# Patient Record
Sex: Female | Born: 1990 | Race: Black or African American | Hispanic: No | Marital: Single | Smoking: Never smoker
Health system: Southern US, Community
[De-identification: ages and names within clinical notes are randomized; demographics above are authoritative.]

## PROBLEM LIST (undated history)

## (undated) ENCOUNTER — Inpatient Hospital Stay (HOSPITAL_COMMUNITY): Payer: Self-pay

## (undated) ENCOUNTER — Emergency Department (HOSPITAL_COMMUNITY): Disposition: A | Discharge status: Home / Self Care | Payer: Medicaid Other

## (undated) DIAGNOSIS — Z3490 Encounter for supervision of normal pregnancy, unspecified, unspecified trimester: Secondary | ICD-10-CM

## (undated) DIAGNOSIS — Z9189 Other specified personal risk factors, not elsewhere classified: Secondary | ICD-10-CM

## (undated) HISTORY — PX: TOOTH EXTRACTION: SUR596

---

## 2013-07-20 NOTE — L&D Delivery Note (Signed)
Delivery Note Pt had a large amount of vaginal bleeding with grapefruit sized clot passed.  Cx 4/80/high.  Placenta felt at os.  Dr. Debroah LoopArnold in to exam pt.  Pitocin started at 150cc/hr.  About 10 minutes later, pt had an urge to push. At  a non-viable female was delivered via  (Presentation: breech ).  APGAR: 0/0 ; weight pending.  40 units of pitocin diluted in 1000cc LR was infused rapidly IV.  The placenta separated spontaneously and delivered via CCT and maternal pushing effort.  It was inspected and appears to be intact with a 3 VC. It is going to be sent t opathology There were the following complications: none  Anesthesia: Epidural  Episiotomy: none Lacerations: none Suture Repair: n/a Est. Blood Loss (mL): 1000cc  Mom to postpartum.

## 2013-07-24 ENCOUNTER — Encounter (HOSPITAL_COMMUNITY): Payer: Self-pay | Admitting: *Deleted

## 2013-07-24 ENCOUNTER — Inpatient Hospital Stay (HOSPITAL_COMMUNITY)
Admission: AD | Admit: 2013-07-24 | Discharge: 2013-07-24 | Disposition: A | Discharge status: Home / Self Care | Payer: Medicaid Other | Source: Ambulatory Visit | Attending: Obstetrics & Gynecology | Admitting: Obstetrics & Gynecology

## 2013-07-24 DIAGNOSIS — O210 Mild hyperemesis gravidarum: Secondary | ICD-10-CM | POA: Insufficient documentation

## 2013-07-24 DIAGNOSIS — O219 Vomiting of pregnancy, unspecified: Secondary | ICD-10-CM

## 2013-07-24 LAB — URINE MICROSCOPIC-ADD ON

## 2013-07-24 LAB — URINALYSIS, ROUTINE W REFLEX MICROSCOPIC
BILIRUBIN URINE: NEGATIVE
GLUCOSE, UA: NEGATIVE mg/dL
Hgb urine dipstick: NEGATIVE
KETONES UR: 15 mg/dL — AB
Nitrite: NEGATIVE
Protein, ur: NEGATIVE mg/dL
Specific Gravity, Urine: 1.02 (ref 1.005–1.030)
Urobilinogen, UA: 1 mg/dL (ref 0.0–1.0)
pH: 7 (ref 5.0–8.0)

## 2013-07-24 LAB — POCT PREGNANCY, URINE: Preg Test, Ur: POSITIVE — AB

## 2013-07-24 MED ORDER — ONDANSETRON HCL 4 MG PO TABS: 4.0000 mg | ORAL_TABLET | Freq: Four times a day (QID) | ORAL | Status: DC

## 2013-07-24 MED ORDER — PROMETHAZINE HCL 25 MG PO TABS: 25.0000 mg | ORAL_TABLET | Freq: Four times a day (QID) | ORAL | Status: DC | PRN

## 2013-07-24 NOTE — Discharge Instructions (Signed)
Morning Sickness Morning sickness is when you feel sick to your stomach (nauseous) during pregnancy. You may feel sick to your stomach and throw up (vomit). You may feel sick in the morning, but you can feel this way any time of day. Some women feel very sick to their stomach and cannot stop throwing up (hyperemesis gravidarum). HOME CARE  Only take medicines as told by your doctor.  Take multivitamins as told by your doctor. Taking multivitamins before getting pregnant can stop or lessen the harshness of morning sickness.  Eat dry toast or unsalted crackers before getting out of bed.  Eat 5 to 6 small meals a day.  Eat dry and bland foods like rice and baked potatoes.  Do not drink liquids with meals. Drink between meals.  Do not eat greasy, fatty, or spicy foods.  Have someone cook for you if the smell of food causes you to feel sick or throw up.  If you feel sick to your stomach after taking prenatal vitamins, take them at night or with a snack.  Eat protein when you need a snack (nuts, yogurt, cheese).  Eat unsweetened gelatins for dessert.  Wear a bracelet used for sea sickness (acupressure wristband).  Go to a doctor that puts thin needles into certain body points (acupuncture) to improve how you feel.  Do not smoke.  Use a humidifier to keep the air in your house free of odors.  Get lots of fresh air. GET HELP IF:  You need medicine to feel better.  You feel dizzy or lightheaded.  You are losing weight. GET HELP RIGHT AWAY IF:   You feel very sick to your stomach and cannot stop throwing up.  You pass out (faint). Document Released: 08/13/2004 Document Revised: 03/08/2013 Document Reviewed: 12/21/2012 [facility] Patient Information 2014 [place], [place]. Pregnancy - First Trimester During sexual intercourse, millions of sperm go into the vagina. Only 1 sperm will penetrate and fertilize the female egg while it is in the Fallopian tube. One week later, the  fertilized egg implants into the wall of the uterus. An embryo begins to develop into a baby. At 6 to 8 weeks, the eyes and face are formed and the heartbeat can be seen on ultrasound. At the end of 12 weeks (first trimester), all the baby's organs are formed. Now that you are pregnant, you will want to do everything you can to have a healthy baby. Two of the most important things are to get good prenatal care and follow your caregiver's instructions. Prenatal care is all the medical care you receive before the baby's birth. It is given to prevent, find, and treat problems during the pregnancy and childbirth. PRENATAL EXAMS  During prenatal visits, your weight, blood pressure, and urine are checked. This is done to make sure you are healthy and progressing normally during the pregnancy.  A pregnant woman should gain 25 to 35 pounds during the pregnancy. However, if you are overweight or underweight, your caregiver will advise you regarding your weight.  Your caregiver will ask and answer questions for you.  Blood work, cervical cultures, other necessary tests, and a Pap test are done during your prenatal exams. These tests are done to check on your health and the probable health of your baby. Tests are strongly recommended and done for HIV with your permission. This is the virus that causes AIDS. These tests are done because medicines can be given to help prevent your baby from being born with this infection should you  have been infected without knowing it. Blood work is also used to find out your blood type, previous infections, and follow your blood levels (hemoglobin).  Low hemoglobin (anemia) is common during pregnancy. Iron and vitamins are given to help prevent this. Later in the pregnancy, blood tests for diabetes will be done along with any other tests if any problems develop.  You may need other tests to make sure you and the baby are doing well. CHANGES DURING THE FIRST TRIMESTER  Your body  goes through many changes during pregnancy. They vary from person to person. Talk to your caregiver about changes you notice and are concerned about. Changes can include:  Your menstrual period stops.  The egg and sperm carry the genes that determine what you look like. Genes from you and your partner are forming a baby. The female genes determine whether the baby is a boy or a girl.  Your body increases in girth and you may feel bloated.  Feeling sick to your stomach (nauseous) and throwing up (vomiting). If the vomiting is uncontrollable, call your caregiver.  Your breasts will begin to enlarge and become tender.  Your nipples may stick out more and become darker.  The need to urinate more. Painful urination may mean you have a bladder infection.  Tiring easily.  Loss of appetite.  Cravings for certain kinds of food.  At first, you may gain or lose a couple of pounds.  You may have changes in your emotions from day to day (excited to be pregnant or concerned something may go wrong with the pregnancy and baby).  You may have more vivid and strange dreams. HOME CARE INSTRUCTIONS   It is very important to avoid all smoking, alcohol and non-prescribed drugs during your pregnancy. These affect the formation and growth of the baby. Avoid chemicals while pregnant to ensure the delivery of a healthy infant.  Start your prenatal visits by the 12th week of pregnancy. They are usually scheduled monthly at first, then more often in the last 2 months before delivery. Keep your caregiver's appointments. Follow your caregiver's instructions regarding medicine use, blood and lab tests, exercise, and diet.  During pregnancy, you are providing food for you and your baby. Eat regular, well-balanced meals. Choose foods such as meat, fish, milk and other low fat dairy products, vegetables, fruits, and whole-grain breads and cereals. Your caregiver will tell you of the ideal weight gain.  You can help  morning sickness by keeping soda crackers at the bedside. Eat a couple before arising in the morning. You may want to use the crackers without salt on them.  Eating 4 to 5 small meals rather than 3 large meals a day also may help the nausea and vomiting.  Drinking liquids between meals instead of during meals also seems to help nausea and vomiting.  A physical sexual relationship may be continued throughout pregnancy if there are no other problems. Problems may be early (premature) leaking of amniotic fluid from the membranes, vaginal bleeding, or belly (abdominal) pain.  Exercise regularly if there are no restrictions. Check with your caregiver or physical therapist if you are unsure of the safety of some of your exercises. Greater weight gain will occur in the last 2 trimesters of pregnancy. Exercising will help:  Control your weight.  Keep you in shape.  Prepare you for labor and delivery.  Help you lose your pregnancy weight after you deliver your baby.  Wear a good support or jogging bra for breast  tenderness during pregnancy. This may help if worn during sleep too.  Ask when prenatal classes are available. Begin classes when they are offered.  Do not use hot tubs, steam rooms, or saunas.  Wear your seat belt when driving. This protects you and your baby if you are in an accident.  Avoid raw meat, uncooked cheese, cat litter boxes, and soil used by cats throughout the pregnancy. These carry germs that can cause birth defects in the baby.  The first trimester is a good time to visit your dentist for your dental health. Getting your teeth cleaned is okay. Use a softer toothbrush and brush gently during pregnancy.  Ask for help if you have financial, counseling, or nutritional needs during pregnancy. Your caregiver will be able to offer counseling for these needs as well as refer you for other special needs.  Do not take any medicines or herbs unless told by your  caregiver.  Inform your caregiver if there is any mental or physical domestic violence.  Make a list of emergency phone numbers of family, friends, hospital, and police and fire departments.  Write down your questions. Take them to your prenatal visit.  Do not douche.  Do not cross your legs.  If you have to stand for long periods of time, rotate you feet or take small steps in a circle.  You may have more vaginal secretions that may require a sanitary pad. Do not use tampons or scented sanitary pads. MEDICINES AND DRUG USE IN PREGNANCY  Take prenatal vitamins as directed. The vitamin should contain 1 milligram of folic acid. Keep all vitamins out of reach of children. Only a couple vitamins or tablets containing iron may be fatal to a baby or young child when ingested.  Avoid use of all medicines, including herbs, over-the-counter medicines, not prescribed or suggested by your caregiver. Only take over-the-counter or prescription medicines for pain, discomfort, or fever as directed by your caregiver. Do not use aspirin, ibuprofen, or naproxen unless directed by your caregiver.  Let your caregiver also know about herbs you may be using.  Alcohol is related to a number of birth defects. This includes fetal alcohol syndrome. All alcohol, in any form, should be avoided completely. Smoking will cause low birth rate and premature babies.  Street or illegal drugs are very harmful to the baby. They are absolutely forbidden. A baby born to an addicted mother will be addicted at birth. The baby will go through the same withdrawal an adult does.  Let your caregiver know about any medicines that you have to take and for what reason you take them. SEEK MEDICAL CARE IF:  You have any concerns or worries during your pregnancy. It is better to call with your questions if you feel they cannot wait, rather than worry about them. SEEK IMMEDIATE MEDICAL CARE IF:   An unexplained oral temperature above  102 F (38.9 C) develops, or as your caregiver suggests.  You have leaking of fluid from the vagina (birth canal). If leaking membranes are suspected, take your temperature and inform your caregiver of this when you call.  There is vaginal spotting or bleeding. Notify your caregiver of the amount and how many pads are used.  You develop a bad smelling vaginal discharge with a change in the color.  You continue to feel sick to your stomach (nauseated) and have no relief from remedies suggested. You vomit blood or coffee ground-like materials.  You lose more than 2 pounds of weight in  1 week.  You gain more than 2 pounds of weight in 1 week and you notice swelling of your face, hands, feet, or legs.  You gain 5 pounds or more in 1 week (even if you do not have swelling of your hands, face, legs, or feet).  You get exposed to [place] measles and have never had them.  You are exposed to fifth disease or chickenpox.  You develop belly (abdominal) pain. Round ligament discomfort is a common non-cancerous (benign) cause of abdominal pain in pregnancy. Your caregiver still must evaluate this.  You develop headache, fever, diarrhea, pain with urination, or shortness of breath.  You fall or are in a car accident or have any kind of trauma.  There is mental or physical violence in your home. Document Released: 06/30/2001 Document Revised: 03/30/2012 Document Reviewed: 01/01/2009 [facility] Patient Information 2014 [place], [place].  For assistance with applying for medicaid please call Fausto Skillern [phone] or Anne Hahn [phone].

## 2013-07-24 NOTE — MAU Note (Signed)
Pt reports nauseated x 2 months, vomiting occasionally. LMP 06/03/2013. Negative preg test in Nov.

## 2013-07-24 NOTE — MAU Provider Note (Signed)
History     CSN: [id number]  Arrival date and time: 07/24/13 4098   First Provider Initiated Contact with Patient 07/24/13 2228      Chief Complaint  Patient presents with  . Possible Pregnancy  . Nausea   HPI Ms. Rhonda Gordon is a 23 y.o. G1P0 at [redacted]w[redacted]d who presents to MAU today with complaint of N/V x 2 months. The patient states that the nausea is daily and vomiting is occasional and usually related to certain foods. She has occasional headaches as well and has not taken any pain medications in > 1 week. She denies abdominal pain or vaginal bleeding today. LMP was 06/03/13.   OB History   Grav Para Term Preterm Abortions TAB SAB Ect Mult Living   1               Past Medical History  Diagnosis Date  . Medical history non-contributory     Past Surgical History  Procedure Laterality Date  . No past surgeries      History reviewed. No pertinent family history.  History  Substance Use Topics  . Smoking status: Never Smoker   . Smokeless tobacco: Not on file  . Alcohol Use: No    Allergies: No Known Allergies  No prescriptions prior to admission    Review of Systems  Gastrointestinal: Positive for nausea and vomiting. Negative for abdominal pain, diarrhea and constipation.  Genitourinary: Negative for dysuria, urgency and frequency.       Neg - vaginal bleeding, discharge   Physical Exam   Blood pressure 125/70, pulse 72, temperature 99 F (37.2 C), temperature source Oral, resp. rate 18, height 5\' 6"  (1.676 m), weight 167 lb (75.751 kg), last menstrual period 06/03/2013, SpO2 100.00%.  Physical Exam  Constitutional: She is oriented to person, place, and time. She appears well-developed and well-nourished. No distress.  HENT:  Head: Normocephalic and atraumatic.  Cardiovascular: Normal rate.   Respiratory: Effort normal.  GI: Soft.  Neurological: She is alert and oriented to person, place, and time.  Skin: Skin is warm and dry. No erythema.   Psychiatric: She has a normal mood and affect.   Results for orders placed during the hospital encounter of 07/24/13 (from the past 24 hour(s))  URINALYSIS, ROUTINE W REFLEX MICROSCOPIC     Status: Abnormal   Collection Time    07/24/13  8:07 PM      Result Value Range   Color, Urine YELLOW  YELLOW   APPearance CLEAR  CLEAR   Specific Gravity, Urine 1.020  1.005 - 1.030   pH 7.0  5.0 - 8.0   Glucose, UA NEGATIVE  NEGATIVE mg/dL   Hgb urine dipstick NEGATIVE  NEGATIVE   Bilirubin Urine NEGATIVE  NEGATIVE   Ketones, ur 15 (*) NEGATIVE mg/dL   Protein, ur NEGATIVE  NEGATIVE mg/dL   Urobilinogen, UA 1.0  0.0 - 1.0 mg/dL   Nitrite NEGATIVE  NEGATIVE   Leukocytes, UA SMALL (*) NEGATIVE  URINE MICROSCOPIC-ADD ON     Status: Abnormal   Collection Time    07/24/13  8:07 PM      Result Value Range   Squamous Epithelial / LPF RARE  RARE   WBC, UA 0-2  <3 WBC/hpf   RBC / HPF 0-2  <3 RBC/hpf   Bacteria, UA FEW (*) RARE   Urine-Other MUCOUS PRESENT    POCT PREGNANCY, URINE     Status: Abnormal   Collection Time    07/24/13  8:38 PM  Result Value Range   Preg Test, Ur POSITIVE (*) NEGATIVE    MAU Course  Procedures None  MDM +UPT UA shows mild dehydration. Patient denies dehydration or nausea at this time.   Assessment and Plan  A: Nausea and vomiting in pregnancy prior to [redacted] weeks gestation + UPT  P: Discharge home Rx for Phenergan and Zofran sent to patient's pharmacy First trimester warning sign discussed [facility] will call her with an appointment Patient may return to MAU as needed or if her condition were to change or worsen  Freddi StarrJulie N Ethier, PA-C  07/25/2013, 12:29 AM

## 2013-08-07 ENCOUNTER — Encounter: Payer: Self-pay | Admitting: Obstetrics and Gynecology

## 2013-09-13 ENCOUNTER — Encounter: Payer: Self-pay | Admitting: Obstetrics and Gynecology

## 2013-09-13 ENCOUNTER — Other Ambulatory Visit (HOSPITAL_COMMUNITY)
Admission: RE | Admit: 2013-09-13 | Discharge: 2013-09-13 | Disposition: A | Discharge status: Home / Self Care | Payer: Medicaid Other | Source: Ambulatory Visit | Attending: Obstetrics and Gynecology | Admitting: Obstetrics and Gynecology

## 2013-09-13 ENCOUNTER — Ambulatory Visit (INDEPENDENT_AMBULATORY_CARE_PROVIDER_SITE_OTHER): Payer: Medicaid Other | Admitting: Obstetrics and Gynecology

## 2013-09-13 VITALS — BP 112/69 | Temp 98.4°F | Wt 175.1 lb

## 2013-09-13 DIAGNOSIS — O219 Vomiting of pregnancy, unspecified: Secondary | ICD-10-CM

## 2013-09-13 DIAGNOSIS — Z113 Encounter for screening for infections with a predominantly sexual mode of transmission: Secondary | ICD-10-CM | POA: Insufficient documentation

## 2013-09-13 DIAGNOSIS — Z3402 Encounter for supervision of normal first pregnancy, second trimester: Secondary | ICD-10-CM

## 2013-09-13 DIAGNOSIS — Z01419 Encounter for gynecological examination (general) (routine) without abnormal findings: Secondary | ICD-10-CM | POA: Insufficient documentation

## 2013-09-13 DIAGNOSIS — Z23 Encounter for immunization: Secondary | ICD-10-CM

## 2013-09-13 DIAGNOSIS — Z3400 Encounter for supervision of normal first pregnancy, unspecified trimester: Secondary | ICD-10-CM

## 2013-09-13 DIAGNOSIS — O0930 Supervision of pregnancy with insufficient antenatal care, unspecified trimester: Secondary | ICD-10-CM

## 2013-09-13 DIAGNOSIS — O0932 Supervision of pregnancy with insufficient antenatal care, second trimester: Secondary | ICD-10-CM

## 2013-09-13 LAB — POCT URINALYSIS DIP (DEVICE)
Bilirubin Urine: NEGATIVE
GLUCOSE, UA: NEGATIVE mg/dL
Ketones, ur: NEGATIVE mg/dL
NITRITE: NEGATIVE
Protein, ur: NEGATIVE mg/dL
Specific Gravity, Urine: 1.025 (ref 1.005–1.030)
Urobilinogen, UA: 0.2 mg/dL (ref 0.0–1.0)
pH: 6.5 (ref 5.0–8.0)

## 2013-09-13 LAB — HIV ANTIBODY (ROUTINE TESTING W REFLEX): HIV: NONREACTIVE

## 2013-09-13 NOTE — Progress Notes (Signed)
P= 106 C/o of intermittent lower abdominal/pelvic pain and pain in buttox.  Pt. States she is unaware if her dating is correct because she was spotting in November but her real last period was in October.  Discussed appropriate weigt gain based on BMI ( 15-25lb); pt. Verbalized understanding.  New OB packet given.  New OB labs today, pap, and flu today.

## 2013-09-13 NOTE — Progress Notes (Signed)
   Subjective:    Rhonda Gordon is a G1P0 5036w0d G1 being seen today for her first obstetrical visit.  Her obstetrical history is significant for late entry to care. Patient does intend to breast feed. Pregnancy history fully reviewed.  Patient reports no complaints. N/V resolved  Filed Vitals:   09/13/13 0824  BP: 140/81  Temp: 98.4 F (36.9 C)  Weight: 79.425 kg (175 lb 1.6 oz)    HISTORY: OB History  Gravida Para Term Preterm AB SAB TAB Ectopic Multiple Living  1             # Outcome Date GA Lbr Len/2nd Weight Sex Delivery Anes PTL Lv  1 CUR              Past Medical History  Diagnosis Date  . Medical history non-contributory    Past Surgical History  Procedure Laterality Date  . No past surgeries    . Tooth extraction     Family History  Problem Relation Age of Onset  . Hypertension Mother      Exam    Uterus:   18-20 wk size, FH 17cm  Pelvic Exam:    Perineum: Small fleshy nevi on left mlabium majoram   Vulva: Bartholin's, Urethra, Skene's normal   Vagina:  normal mucosa, normal discharge       Cervix: nulliparous appearance and ext ftp/ int closed 2.5 cm long/high   Adnexa: not evaluated   Bony Pelvis: average  System: Breast:  normal appearance, no masses or tenderness   Skin: normal coloration and turgor, no rashes    Neurologic: oriented, normal, grossly non-focal   Extremities: normal strength, tone, and muscle mass   HEENT PERRLA, extra ocular movement intact, neck supple with midline trachea and thyroid without masses   Mouth/Teeth mucous membranes moist, pharynx normal without lesions and dental hygiene good   Neck supple and no masses   Cardiovascular: regular rate and rhythm, no murmurs or gallops   Respiratory:  appears well, vitals normal, no respiratory distress, acyanotic, normal RR, ear and throat exam is normal, neck free of mass or lymphadenopathy, chest clear, no wheezing, crepitations, rhonchi, normal symmetric air entry   Abdomen: DT 140, NT   Urinary: urethral meatus normal      Assessment:    Pregnancy: G1P0 Patient Active Problem List   Diagnosis Date Noted  . Need for prophylactic vaccination and inoculation against influenza 09/13/2013  . Encounter for supervision of normal first pregnancy in second trimester 09/13/2013  . Insufficient prenatal care in second trimester 09/13/2013        Plan:     Initial labs drawn. Prenatal vitamins. Problem list reviewed and updated. Genetic Screening discussed Quad Screen: ordered.  Ultrasound discussed; fetal survey: ordered.  Follow up in 4 weeks. 50% of 30  min visit spent on counseling and coordination of care.  Visit schedule, pregnancy precautions reviewed. Flu vaccine and PN labs today  Watch BP  Sha Amer 09/13/2013

## 2013-09-13 NOTE — Patient Instructions (Signed)
Second Trimester of Pregnancy The second trimester is from week 13 through week 28, months 4 through 6. The second trimester is often a time when you feel your best. Your body has also adjusted to being pregnant, and you begin to feel better physically. Usually, morning sickness has lessened or quit completely, you may have more energy, and you may have an increase in appetite. The second trimester is also a time when the fetus is growing rapidly. At the end of the sixth month, the fetus is about 9 inches long and weighs about 1 pounds. You will likely begin to feel the baby move (quickening) between 18 and 20 weeks of the pregnancy. BODY CHANGES Your body goes through many changes during pregnancy. The changes vary from woman to woman.   Your weight will continue to increase. You will notice your lower abdomen bulging out.  You may begin to get stretch marks on your hips, abdomen, and breasts.  You may develop headaches that can be relieved by medicines approved by your caregiver.  You may urinate more often because the fetus is pressing on your bladder.  You may develop or continue to have heartburn as a result of your pregnancy.  You may develop constipation because certain hormones are causing the muscles that push waste through your intestines to slow down.  You may develop hemorrhoids or swollen, bulging veins (varicose veins).  You may have back pain because of the weight gain and pregnancy hormones relaxing your joints between the bones in your pelvis and as a result of a shift in weight and the muscles that support your balance.  Your breasts will continue to grow and be tender.  Your gums may bleed and may be sensitive to brushing and flossing.  Dark spots or blotches (chloasma, mask of pregnancy) may develop on your face. This will likely fade after the baby is born.  A dark line from your belly button to the pubic area (linea nigra) may appear. This will likely fade after the  baby is born. WHAT TO EXPECT AT YOUR PRENATAL VISITS During a routine prenatal visit:  You will be weighed to make sure you and the fetus are growing normally.  Your blood pressure will be taken.  Your abdomen will be measured to track your baby's growth.  The fetal heartbeat will be listened to.  Any test results from the previous visit will be discussed. Your caregiver may ask you:  How you are feeling.  If you are feeling the baby move.  If you have had any abnormal symptoms, such as leaking fluid, bleeding, severe headaches, or abdominal cramping.  If you have any questions. Other tests that may be performed during your second trimester include:  Blood tests that check for:  Low iron levels (anemia).  Gestational diabetes (between 24 and 28 weeks).  Rh antibodies.  Urine tests to check for infections, diabetes, or protein in the urine.  An ultrasound to confirm the proper growth and development of the baby.  An amniocentesis to check for possible genetic problems.  Fetal screens for spina bifida and Down syndrome. HOME CARE INSTRUCTIONS   Avoid all smoking, herbs, alcohol, and unprescribed drugs. These chemicals affect the formation and growth of the baby.  Follow your caregiver's instructions regarding medicine use. There are medicines that are either safe or unsafe to take during pregnancy.  Exercise only as directed by your caregiver. Experiencing uterine cramps is a good sign to stop exercising.  Continue to eat regular,   healthy meals.  Wear a good support bra for breast tenderness.  Do not use hot tubs, steam rooms, or saunas.  Wear your seat belt at all times when driving.  Avoid raw meat, uncooked cheese, cat litter boxes, and soil used by cats. These carry germs that can cause birth defects in the baby.  Take your prenatal vitamins.  Try taking a stool softener (if your caregiver approves) if you develop constipation. Eat more high-fiber foods,  such as fresh vegetables or fruit and whole grains. Drink plenty of fluids to keep your urine clear or pale yellow.  Take warm sitz baths to soothe any pain or discomfort caused by hemorrhoids. Use hemorrhoid cream if your caregiver approves.  If you develop varicose veins, wear support hose. Elevate your feet for 15 minutes, 3 4 times a day. Limit salt in your diet.  Avoid heavy lifting, wear low heel shoes, and practice good posture.  Rest with your legs elevated if you have leg cramps or low back pain.  Visit your dentist if you have not gone yet during your pregnancy. Use a soft toothbrush to brush your teeth and be gentle when you floss.  A sexual relationship may be continued unless your caregiver directs you otherwise.  Continue to go to all your prenatal visits as directed by your caregiver. SEEK MEDICAL CARE IF:   You have dizziness.  You have mild pelvic cramps, pelvic pressure, or nagging pain in the abdominal area.  You have persistent nausea, vomiting, or diarrhea.  You have a bad smelling vaginal discharge.  You have pain with urination. SEEK IMMEDIATE MEDICAL CARE IF:   You have a fever.  You are leaking fluid from your vagina.  You have spotting or bleeding from your vagina.  You have severe abdominal cramping or pain.  You have rapid weight gain or loss.  You have shortness of breath with chest pain.  You notice sudden or extreme swelling of your face, hands, ankles, feet, or legs.  You have not felt your baby move in over an hour.  You have severe headaches that do not go away with medicine.  You have vision changes. Document Released: 06/30/2001 Document Revised: 03/08/2013 Document Reviewed: 09/06/2012 [employer] Patient Information 2014 [employer].  

## 2013-09-14 ENCOUNTER — Observation Stay (HOSPITAL_COMMUNITY)
Admission: AD | Admit: 2013-09-14 | Discharge: 2013-09-15 | Disposition: A | Discharge status: Home / Self Care | Payer: Medicaid Other | Source: Ambulatory Visit | Attending: Obstetrics & Gynecology | Admitting: Obstetrics & Gynecology

## 2013-09-14 ENCOUNTER — Inpatient Hospital Stay (HOSPITAL_COMMUNITY): Payer: Medicaid Other

## 2013-09-14 ENCOUNTER — Encounter (HOSPITAL_COMMUNITY): Payer: Self-pay | Admitting: *Deleted

## 2013-09-14 DIAGNOSIS — O09212 Supervision of pregnancy with history of pre-term labor, second trimester: Secondary | ICD-10-CM

## 2013-09-14 DIAGNOSIS — Z87891 Personal history of nicotine dependence: Secondary | ICD-10-CM | POA: Insufficient documentation

## 2013-09-14 DIAGNOSIS — N949 Unspecified condition associated with female genital organs and menstrual cycle: Secondary | ICD-10-CM | POA: Insufficient documentation

## 2013-09-14 DIAGNOSIS — O034 Incomplete spontaneous abortion without complication: Principal | ICD-10-CM | POA: Insufficient documentation

## 2013-09-14 DIAGNOSIS — O3430 Maternal care for cervical incompetence, unspecified trimester: Secondary | ICD-10-CM | POA: Insufficient documentation

## 2013-09-14 LAB — PRESCRIPTION MONITORING PROFILE (19 PANEL)
Amphetamine/Meth: NEGATIVE ng/mL
BENZODIAZEPINE SCREEN, URINE: NEGATIVE ng/mL
Barbiturate Screen, Urine: NEGATIVE ng/mL
Buprenorphine, Urine: NEGATIVE ng/mL
Cannabinoid Scrn, Ur: NEGATIVE ng/mL
Carisoprodol, Urine: NEGATIVE ng/mL
Cocaine Metabolites: NEGATIVE ng/mL
Creatinine, Urine: 112.36 mg/dL (ref >20.0)
ECSTASY: NEGATIVE ng/mL
FENTANYL URINE: NEGATIVE ng/mL
Meperidine, Ur: NEGATIVE ng/mL
Methadone Screen, Urine: NEGATIVE ng/mL
Methaqualone: NEGATIVE ng/mL
Nitrites, Initial: NEGATIVE ug/mL
Opiate Screen, Urine: NEGATIVE ng/mL
Oxycodone Screen, Ur: NEGATIVE ng/mL
PH URINE, INITIAL: 6.4 pH (ref 4.5–8.9)
Phencyclidine, Ur: NEGATIVE ng/mL
Propoxyphene: NEGATIVE ng/mL
TRAMADOL UR: NEGATIVE ng/mL
Tapentadol, urine: NEGATIVE ng/mL
ZOLPIDEM, URINE: NEGATIVE ng/mL

## 2013-09-14 LAB — OBSTETRIC PANEL
ANTIBODY SCREEN: NEGATIVE
BASOS ABS: 0 10*3/uL (ref 0.0–0.1)
Basophils Relative: 0 % (ref 0–1)
EOS PCT: 1 % (ref 0–5)
Eosinophils Absolute: 0.1 10*3/uL (ref 0.0–0.7)
HEMATOCRIT: 34.7 % — AB (ref 36.0–46.0)
HEMOGLOBIN: 12.1 g/dL (ref 12.0–15.0)
Hepatitis B Surface Ag: NEGATIVE
Lymphocytes Relative: 21 % (ref 12–46)
Lymphs Abs: 1.5 10*3/uL (ref 0.7–4.0)
MCH: 31 pg (ref 26.0–34.0)
MCHC: 34.9 g/dL (ref 30.0–36.0)
MCV: 89 fL (ref 78.0–100.0)
MONO ABS: 0.6 10*3/uL (ref 0.1–1.0)
MONOS PCT: 8 % (ref 3–12)
NEUTROS ABS: 5 10*3/uL (ref 1.7–7.7)
Neutrophils Relative %: 70 % (ref 43–77)
Platelets: 215 10*3/uL (ref 150–400)
RBC: 3.9 MIL/uL (ref 3.87–5.11)
RDW: 14.6 % (ref 11.5–15.5)
RH TYPE: NEGATIVE
Rubella: 0.41 Index (ref <0.90)
WBC: 7.1 10*3/uL (ref 4.0–10.5)

## 2013-09-14 LAB — URINE MICROSCOPIC-ADD ON

## 2013-09-14 LAB — URINALYSIS, ROUTINE W REFLEX MICROSCOPIC
BILIRUBIN URINE: NEGATIVE
Glucose, UA: NEGATIVE mg/dL
KETONES UR: NEGATIVE mg/dL
Nitrite: NEGATIVE
PH: 7 (ref 5.0–8.0)
Protein, ur: NEGATIVE mg/dL
Urobilinogen, UA: 0.2 mg/dL (ref 0.0–1.0)

## 2013-09-14 LAB — ALCOHOL METABOLITE (ETG), URINE: Ethyl Glucuronide (EtG): NEGATIVE ng/mL

## 2013-09-14 MED ORDER — OXYTOCIN BOLUS FROM INFUSION: 500.0000 mL | INTRAVENOUS | Status: DC

## 2013-09-14 MED ORDER — BUTORPHANOL TARTRATE 1 MG/ML IJ SOLN
1.0000 mg | INTRAMUSCULAR | Status: DC | PRN
  Administered 2013-09-15: 1 mg via INTRAVENOUS
  Filled 2013-09-14: qty 1

## 2013-09-14 MED ORDER — ACETAMINOPHEN 325 MG PO TABS: 650.0000 mg | ORAL_TABLET | ORAL | Status: DC | PRN

## 2013-09-14 MED ORDER — IBUPROFEN 600 MG PO TABS
600.0000 mg | ORAL_TABLET | Freq: Four times a day (QID) | ORAL | Status: DC | PRN
Start: 2013-09-14 — End: 2013-09-15

## 2013-09-14 MED ORDER — LACTATED RINGERS IV SOLN: 500.0000 mL | INTRAVENOUS | Status: DC | PRN

## 2013-09-14 MED ORDER — CITRIC ACID-SODIUM CITRATE 334-500 MG/5ML PO SOLN
30.0000 mL | ORAL | Status: DC | PRN
  Filled 2013-09-14 (×2): qty 15

## 2013-09-14 MED ORDER — ONDANSETRON HCL 4 MG/2ML IJ SOLN: 4.0000 mg | Freq: Four times a day (QID) | INTRAMUSCULAR | Status: DC | PRN

## 2013-09-14 MED ORDER — OXYTOCIN 40 UNITS IN LACTATED RINGERS INFUSION - SIMPLE MED
62.5000 mL/h | INTRAVENOUS | Status: DC
  Administered 2013-09-15: 150 mL/h via INTRAVENOUS
  Filled 2013-09-14: qty 1000

## 2013-09-14 MED ORDER — LACTATED RINGERS IV SOLN
INTRAVENOUS | Status: DC
  Administered 2013-09-15: via INTRAVENOUS

## 2013-09-14 MED ORDER — LIDOCAINE HCL (PF) 1 % IJ SOLN
30.0000 mL | INTRAMUSCULAR | Status: DC | PRN
  Filled 2013-09-14: qty 30

## 2013-09-14 MED ORDER — OXYCODONE-ACETAMINOPHEN 5-325 MG PO TABS: 1.0000 | ORAL_TABLET | ORAL | Status: DC | PRN

## 2013-09-14 NOTE — MAU Note (Signed)
Pt reports lower abd pressure and pain x 4 hours, ? Leaking fluid.

## 2013-09-14 NOTE — MAU Provider Note (Signed)
History     CSN: [id number]  Arrival date and time: 09/14/13 2126   First Provider Initiated Contact with Patient 09/14/13 2210      Chief Complaint  Patient presents with  . Pelvic Pain  . Rupture of Membranes   Pelvic Pain The patient's primary symptoms include pelvic pain.    Rhonda Gordon is a 23 y.o. G1P0 at 2236w1d who presents today with pressure and possible leaking of fluid. She states that she was seen yesterday and had a cervical exam, and was told that her "cervix seemed short". Today she has been cramping off and on, and has had some bleeding since arriving here.   Past Medical History  Diagnosis Date  . Medical history non-contributory     Past Surgical History  Procedure Laterality Date  . No past surgeries    . Tooth extraction      Family History  Problem Relation Age of Onset  . Hypertension Mother     History  Substance Use Topics  . Smoking status: Former Games developermoker  . Smokeless tobacco: Never Used  . Alcohol Use: No    Allergies: No Known Allergies  Prescriptions prior to admission  Medication Sig Dispense Refill  . ondansetron (ZOFRAN) 4 MG tablet Take 1 tablet (4 mg total) by mouth every 6 (six) hours.  12 tablet  0  . Prenatal Vit-Fe Fumarate-FA (MULTIVITAMIN-PRENATAL) 27-0.8 MG TABS tablet Take 1 tablet by mouth daily at 12 noon.      . promethazine (PHENERGAN) 25 MG tablet Take 1 tablet (25 mg total) by mouth every 6 (six) hours as needed for nausea or vomiting.  30 tablet  0    Review of Systems  Genitourinary: Positive for pelvic pain.   Physical Exam   Blood pressure 127/80, pulse 109, temperature 98.6 F (37 C), temperature source Oral, resp. rate 18, height 5\' 6"  (1.676 m), weight 80.74 kg (178 lb), last menstrual period 05/03/2013, SpO2 100.00%.  Physical Exam  Nursing note and vitals reviewed. Constitutional: She is oriented to person, place, and time. She appears well-developed and well-nourished. No distress.   Cardiovascular: Normal rate.   Respiratory: Effort normal.  GI: Soft. There is no tenderness.  Genitourinary:   External: no lesion Vagina: small amount of blood seen  Cervix: unable to see any cervix. Membranes bulging into vagina.  Uterus: AGA, +FHT    Neurological: She is alert and oriented to person, place, and time.  Skin: Skin is warm and dry.  Psychiatric: She has a normal mood and affect.    MAU Course  Procedures  Results for orders placed during the hospital encounter of 09/14/13 (from the past 24 hour(s))  URINALYSIS, ROUTINE W REFLEX MICROSCOPIC     Status: Abnormal   Collection Time    09/14/13  9:36 PM      Result Value Ref Range   Color, Urine YELLOW  YELLOW   APPearance CLEAR  CLEAR   Specific Gravity, Urine <1.005 (*) 1.005 - 1.030   pH 7.0  5.0 - 8.0   Glucose, UA NEGATIVE  NEGATIVE mg/dL   Hgb urine dipstick MODERATE (*) NEGATIVE   Bilirubin Urine NEGATIVE  NEGATIVE   Ketones, ur NEGATIVE  NEGATIVE mg/dL   Protein, ur NEGATIVE  NEGATIVE mg/dL   Urobilinogen, UA 0.2  0.0 - 1.0 mg/dL   Nitrite NEGATIVE  NEGATIVE   Leukocytes, UA TRACE (*) NEGATIVE  URINE MICROSCOPIC-ADD ON     Status: None   Collection Time  09/14/13  9:36 PM      Result Value Ref Range   Squamous Epithelial / LPF RARE  RARE   WBC, UA 0-2  <3 WBC/hpf   RBC / HPF 0-2  <3 RBC/hpf   Bacteria, UA RARE  RARE   2303: D/W Dr. Debroah Loop. Will await ultrasound report for GA and cervical measurements. He will come to the unit to see the patient.   Assessment and Plan  Cervical insufficiency  Pre-viable fetus 22.1 weeks by ultrasound with uncertain dating and preterm labor and advanced cervical dilation and effacement. She appears uncomfortable in active labor. She was advised that resuscitation would not be performed at this gestational age as the baby could not respond and survive.   Plan: Admit to L&D Adam Phenix, MD 11:46 PM  Tawnya Crook 09/14/2013, 10:19 PM

## 2013-09-15 ENCOUNTER — Encounter (HOSPITAL_COMMUNITY): Payer: Self-pay | Admitting: *Deleted

## 2013-09-15 DIAGNOSIS — O3430 Maternal care for cervical incompetence, unspecified trimester: Secondary | ICD-10-CM

## 2013-09-15 DIAGNOSIS — N949 Unspecified condition associated with female genital organs and menstrual cycle: Secondary | ICD-10-CM

## 2013-09-15 DIAGNOSIS — Z87891 Personal history of nicotine dependence: Secondary | ICD-10-CM

## 2013-09-15 DIAGNOSIS — O034 Incomplete spontaneous abortion without complication: Secondary | ICD-10-CM

## 2013-09-15 LAB — CBC
HCT: 26.3 % — ABNORMAL LOW (ref 36.0–46.0)
HCT: 32.6 % — ABNORMAL LOW (ref 36.0–46.0)
Hemoglobin: 11.6 g/dL — ABNORMAL LOW (ref 12.0–15.0)
Hemoglobin: 9.4 g/dL — ABNORMAL LOW (ref 12.0–15.0)
MCH: 31 pg (ref 26.0–34.0)
MCH: 31.3 pg (ref 26.0–34.0)
MCHC: 35.6 g/dL (ref 30.0–36.0)
MCHC: 35.7 g/dL (ref 30.0–36.0)
MCV: 86.8 fL (ref 78.0–100.0)
MCV: 87.9 fL (ref 78.0–100.0)
PLATELETS: 199 10*3/uL (ref 150–400)
PLATELETS: 224 10*3/uL (ref 150–400)
RBC: 3.03 MIL/uL — ABNORMAL LOW (ref 3.87–5.11)
RBC: 3.71 MIL/uL — ABNORMAL LOW (ref 3.87–5.11)
RDW: 13.2 % (ref 11.5–15.5)
RDW: 13.3 % (ref 11.5–15.5)
WBC: 12.7 10*3/uL — ABNORMAL HIGH (ref 4.0–10.5)
WBC: 9.7 10*3/uL (ref 4.0–10.5)

## 2013-09-15 LAB — RAPID URINE DRUG SCREEN, HOSP PERFORMED
AMPHETAMINES: NOT DETECTED
BARBITURATES: NOT DETECTED
Benzodiazepines: NOT DETECTED
Cocaine: NOT DETECTED
Opiates: NOT DETECTED
TETRAHYDROCANNABINOL: NOT DETECTED

## 2013-09-15 LAB — ABO/RH: ABO/RH(D): A NEG

## 2013-09-15 LAB — PREPARE RBC (CROSSMATCH)

## 2013-09-15 MED ORDER — RHO D IMMUNE GLOBULIN 1500 UNIT/2ML IJ SOLN
300.0000 ug | Freq: Once | INTRAMUSCULAR | Status: AC
Start: 2013-09-15 — End: 2013-09-15
  Administered 2013-09-15: 300 ug via INTRAMUSCULAR
  Filled 2013-09-15: qty 2

## 2013-09-15 MED ORDER — ONDANSETRON HCL 4 MG/2ML IJ SOLN: 4.0000 mg | INTRAMUSCULAR | Status: DC | PRN

## 2013-09-15 MED ORDER — SENNOSIDES-DOCUSATE SODIUM 8.6-50 MG PO TABS: 2.0000 | ORAL_TABLET | ORAL | Status: DC

## 2013-09-15 MED ORDER — MISOPROSTOL 200 MCG PO TABS
400.0000 ug | ORAL_TABLET | Freq: Once | ORAL | Status: AC
  Administered 2013-09-15: 400 ug via ORAL
  Filled 2013-09-15: qty 2

## 2013-09-15 MED ORDER — SIMETHICONE 80 MG PO CHEW: 80.0000 mg | CHEWABLE_TABLET | ORAL | Status: DC | PRN

## 2013-09-15 MED ORDER — RHO D IMMUNE GLOBULIN 1500 UNIT/2ML IJ SOLN
300.0000 ug | Freq: Once | INTRAMUSCULAR | Status: DC
  Filled 2013-09-15: qty 2

## 2013-09-15 MED ORDER — ZOLPIDEM TARTRATE 5 MG PO TABS
5.0000 mg | ORAL_TABLET | Freq: Every evening | ORAL | Status: DC | PRN
  Administered 2013-09-15: 5 mg via ORAL
  Filled 2013-09-15: qty 1

## 2013-09-15 MED ORDER — OXYTOCIN BOLUS FROM INFUSION: 500.0000 mL | INTRAVENOUS | Status: DC

## 2013-09-15 MED ORDER — DIBUCAINE 1 % RE OINT: 1.0000 "application " | TOPICAL_OINTMENT | RECTAL | Status: DC | PRN

## 2013-09-15 MED ORDER — METHYLERGONOVINE MALEATE 0.2 MG/ML IJ SOLN: 0.2000 mg | INTRAMUSCULAR | Status: DC | PRN

## 2013-09-15 MED ORDER — METHYLERGONOVINE MALEATE 0.2 MG PO TABS: 0.2000 mg | ORAL_TABLET | ORAL | Status: DC | PRN

## 2013-09-15 MED ORDER — OXYTOCIN 40 UNITS IN LACTATED RINGERS INFUSION - SIMPLE MED: 62.5000 mL/h | INTRAVENOUS | Status: DC | PRN

## 2013-09-15 MED ORDER — HYDROMORPHONE HCL PF 1 MG/ML IJ SOLN: 1.0000 mg | INTRAMUSCULAR | Status: DC | PRN

## 2013-09-15 MED ORDER — ONDANSETRON HCL 4 MG PO TABS: 4.0000 mg | ORAL_TABLET | ORAL | Status: DC | PRN

## 2013-09-15 MED ORDER — DIPHENHYDRAMINE HCL 25 MG PO CAPS: 25.0000 mg | ORAL_CAPSULE | Freq: Four times a day (QID) | ORAL | Status: DC | PRN

## 2013-09-15 MED ORDER — PRENATAL MULTIVITAMIN CH: 1.0000 | ORAL_TABLET | Freq: Every day | ORAL | Status: DC

## 2013-09-15 MED ORDER — MEASLES, MUMPS & RUBELLA VAC ~~LOC~~ INJ
0.5000 mL | INJECTION | Freq: Once | SUBCUTANEOUS | Status: AC
  Administered 2013-09-15: 0.5 mL via SUBCUTANEOUS
  Filled 2013-09-15: qty 0.5

## 2013-09-15 MED ORDER — FERROUS SULFATE 325 (65 FE) MG PO TABS
325.0000 mg | ORAL_TABLET | Freq: Two times a day (BID) | ORAL | Status: DC
  Administered 2013-09-15: 325 mg via ORAL
  Filled 2013-09-15: qty 1

## 2013-09-15 MED ORDER — BENZOCAINE-MENTHOL 20-0.5 % EX AERO: 1.0000 "application " | INHALATION_SPRAY | CUTANEOUS | Status: DC | PRN

## 2013-09-15 MED ORDER — LANOLIN HYDROUS EX OINT: TOPICAL_OINTMENT | CUTANEOUS | Status: DC | PRN

## 2013-09-15 MED ORDER — OXYCODONE-ACETAMINOPHEN 5-325 MG PO TABS: 1.0000 | ORAL_TABLET | ORAL | Status: DC | PRN

## 2013-09-15 MED ORDER — WITCH HAZEL-GLYCERIN EX PADS: 1.0000 "application " | MEDICATED_PAD | CUTANEOUS | Status: DC | PRN

## 2013-09-15 MED ORDER — IBUPROFEN 600 MG PO TABS: 600.0000 mg | ORAL_TABLET | Freq: Four times a day (QID) | ORAL | Status: DC

## 2013-09-15 MED ORDER — BISACODYL 10 MG RE SUPP: 10.0000 mg | Freq: Every day | RECTAL | Status: DC | PRN

## 2013-09-15 MED ORDER — FLEET ENEMA 7-19 GM/118ML RE ENEM: 1.0000 | ENEMA | Freq: Every day | RECTAL | Status: DC | PRN

## 2013-09-15 MED ORDER — IBUPROFEN 600 MG PO TABS
600.0000 mg | ORAL_TABLET | Freq: Four times a day (QID) | ORAL | Status: DC
  Administered 2013-09-15: 600 mg via ORAL
  Filled 2013-09-15: qty 1

## 2013-09-15 NOTE — Progress Notes (Signed)
I spoke with pt and FOB who were coping as well as can be expected.  They were dealing with funeral arrangements and taking care of details.  I let them know about how grief may affect them differently on different days and they may find it helpful to talk down the line.  I gave them my card for follow-up bereavement support and they are also aware of community resources such as [facility].    Centex CorporationChaplain Katy Freeland Pracht Pager, [phone] 9:59 AM   09/15/13 0900  Clinical Encounter Type  Visited With Patient and family together  Visit Type Spiritual support  Referral From Nurse  Spiritual Encounters  Spiritual Needs Grief support

## 2013-09-15 NOTE — Progress Notes (Signed)
Pt with an urge to push.  BBOW at introitus, no cx palpated digitally.  After a few pushes, SROM.  CX noted to be 4/80/high.  No pp felt in cx.  Will allow to continue to labor.

## 2013-09-15 NOTE — Progress Notes (Signed)
Pt is discharged in the care of friend. Downstairs per ambulatory. Emotional support given due to lost. Chaplain in and visited earlier. Pt very talkative able lost. Denies any excessive vaginal bleeding. StableDischarge instructions with Rx given to pt. Questions asked and answered.

## 2013-09-15 NOTE — Progress Notes (Signed)
UR chart review completed.  

## 2013-09-15 NOTE — Discharge Instructions (Signed)

## 2013-09-16 LAB — CULTURE, OB URINE

## 2013-09-16 LAB — RH IG WORKUP (INCLUDES ABO/RH)
ABO/RH(D): A NEG
Fetal Screen: NEGATIVE
Gestational Age(Wks): 21
Unit division: 0

## 2013-09-17 LAB — TYPE AND SCREEN
ABO/RH(D): A NEG
ANTIBODY SCREEN: NEGATIVE
UNIT DIVISION: 0
Unit division: 0

## 2013-09-18 ENCOUNTER — Inpatient Hospital Stay (HOSPITAL_COMMUNITY)
Admission: AD | Admit: 2013-09-18 | Discharge: 2013-09-18 | Disposition: A | Discharge status: Home / Self Care | Payer: Medicaid Other | Source: Ambulatory Visit | Attending: Obstetrics & Gynecology | Admitting: Obstetrics & Gynecology

## 2013-09-18 DIAGNOSIS — O9279 Other disorders of lactation: Secondary | ICD-10-CM | POA: Insufficient documentation

## 2013-09-18 LAB — HEMOGLOBINOPATHY EVALUATION
HEMOGLOBIN OTHER: 0 %
HGB A2 QUANT: 3 % (ref 2.2–3.2)
Hgb A: 97 % (ref 96.8–97.8)
Hgb F Quant: 0 % (ref 0.0–2.0)
Hgb S Quant: 0 %

## 2013-09-18 LAB — ALPHA FETOPROTEIN, MATERNAL
AFP: 66.9 IU/mL
Curr Gest Age: 19 wks.days
MoM for AFP: 1.71
OPEN SPINA BIFIDA: NEGATIVE

## 2013-09-18 NOTE — MAU Note (Signed)
Pt was evaluated by the NICU lactation consultant and discharged home.

## 2013-09-18 NOTE — Consult Note (Signed)
Lactation consult on this mom who had a miscarriage 3 days ago, of a [redacted] week gestation pregnancy. Her chief complaint is engorgement. On exam,  her breasts are  very full, with palpable  knots of milk, swollen ducts under both armpits, very tender, and warm. Mom is  wearing 2 tight sports bras, and was given ice packs by the MAU nurse to apply to her breasts. I advised mom to continue with the 2 bras, and to take her prescribed does of ibuprofen, and to use cold cabbage leaves , covering her whole breast, and continually change the cabbage as it wilts. I told mom it will take 24-48 hours to get better. She was relieved to hear it was engorgement, and not something else. Mom knows to call lactation for questions/concerns.

## 2013-09-18 NOTE — MAU Note (Signed)
Ice packs applied to both breasts

## 2013-09-18 NOTE — MAU Note (Signed)
Patient states she delivered her baby on 2-27. States she is having knots under both arms and breasts are full and painful.

## 2013-09-18 NOTE — H&P (Signed)
Author: Adam Phenix, MD Service: Obstetrics Author Type: Physician    Filed: 09/14/2013 11:46 PM Note Time: 09/14/2013 10:19 PM Status: Signed    Editor: Adam Phenix, MD (Physician)        Related Notes: Original Note by Tawnya Crook, CNM (Certified Nurse Midwife) filed at 09/14/2013 11:04 PM       History        CSN: [id number]   Arrival date and time: 09/14/13 2126    First Provider Initiated Contact with Patient 09/14/13 2210          Chief Complaint   Patient presents with   .  Pelvic Pain   .  Rupture of Membranes      Pelvic Pain The patient's primary symptoms include pelvic pain.      Rhonda Gordon is a 23 y.o. G1P0 at [redacted]w[redacted]d who presents today with pressure and possible leaking of fluid. She states that she was seen yesterday and had a cervical exam, and was told that her "cervix seemed short". Today she has been cramping off and on, and has had some bleeding since arriving here.     Past Medical History   Diagnosis  Date   .  Medical history non-contributory           Past Surgical History   Procedure  Laterality  Date   .  No past surgeries       .  Tooth extraction             Family History   Problem  Relation  Age of Onset   .  Hypertension  Mother           History   Substance Use Topics   .  Smoking status:  Former Games developer   .  Smokeless tobacco:  Never Used   .  Alcohol Use:  No        Allergies: No Known Allergies    Prescriptions prior to admission   Medication  Sig  Dispense  Refill   .  ondansetron (ZOFRAN) 4 MG tablet  Take 1 tablet (4 mg total) by mouth every 6 (six) hours.   12 tablet   0   .  Prenatal Vit-Fe Fumarate-FA (MULTIVITAMIN-PRENATAL) 27-0.8 MG TABS tablet  Take 1 tablet by mouth daily at 12 noon.         .  promethazine (PHENERGAN) 25 MG tablet  Take 1 tablet (25 mg total) by mouth every 6 (six) hours as needed for nausea or vomiting.   30 tablet   0        Review of Systems  Genitourinary:  Positive for pelvic pain.     Physical Exam      Blood pressure 127/80, pulse 109, temperature 98.6 F (37 C), temperature source Oral, resp. rate 18, height 5\' 6"  (1.676 m), weight 80.74 kg (178 lb), last menstrual period 05/03/2013, SpO2 100.00%.   Physical Exam  Nursing note and vitals reviewed. Constitutional: She is oriented to person, place, and time. She appears well-developed and well-nourished. No distress.  Cardiovascular: Normal rate.   Respiratory: Effort normal.  GI: Soft. There is no tenderness.  Genitourinary:   External: no lesion Vagina: small amount of blood seen  Cervix: unable to see any cervix. Membranes bulging into vagina.  Uterus: AGA, +FHT   Neurological: She is alert and oriented to person, place, and time.  Skin: Skin is warm and dry.  Psychiatric: She has a normal mood and  affect.       MAU Course    Procedures    Results for orders placed during the hospital encounter of 09/14/13 (from the past 24 hour(s))   URINALYSIS, ROUTINE W REFLEX MICROSCOPIC     Status: Abnormal     Collection Time      09/14/13  9:36 PM       Result  Value  Ref Range     Color, Urine  YELLOW   YELLOW     APPearance  CLEAR   CLEAR     Specific Gravity, Urine  <1.005 (*)  1.005 - 1.030     pH  7.0   5.0 - 8.0     Glucose, UA  NEGATIVE   NEGATIVE mg/dL     Hgb urine dipstick  MODERATE (*)  NEGATIVE     Bilirubin Urine  NEGATIVE   NEGATIVE     Ketones, ur  NEGATIVE   NEGATIVE mg/dL     Protein, ur  NEGATIVE   NEGATIVE mg/dL     Urobilinogen, UA  0.2   0.0 - 1.0 mg/dL     Nitrite  NEGATIVE   NEGATIVE     Leukocytes, UA  TRACE (*)  NEGATIVE   URINE MICROSCOPIC-ADD ON     Status: None     Collection Time      09/14/13  9:36 PM       Result  Value  Ref Range     Squamous Epithelial / LPF  RARE   RARE     WBC, UA  0-2   <3 WBC/hpf     RBC / HPF  0-2   <3 RBC/hpf     Bacteria, UA  RARE   RARE      2303: D/W Dr. Debroah LoopArnold. Will await ultrasound report for GA and  cervical measurements. He will come to the unit to see the patient.     Assessment and Plan    Cervical insufficiency   Pre-viable fetus 21.1 weeks by ultrasound with uncertain dating and preterm labor and advanced cervical dilation and effacement. She appears uncomfortable in active labor. She was advised that resuscitation would not be performed at this gestational age as the baby could not respond and survive.    Plan: Admit to L&D Adam PhenixJames G Arnold, MD 11:46 PM   Tawnya CrookHogan, Heather Donovan 09/14/2013, 10:19 PM

## 2013-09-19 NOTE — Discharge Summary (Signed)
Obstetric Discharge Summary Reason for Admission: onset of labor Prenatal Procedures: none Intrapartum Procedures: SVD of nonviable, stillborn female Postpartum Procedures: none Complications-Operative and Postpartum: none   Hemoglobin  Date Value Ref Range Status  09/15/2013 9.4* 12.0 - 15.0 g/dL Final     REPEATED TO VERIFY     DELTA CHECK NOTED     HCT  Date Value Ref Range Status  09/15/2013 26.3* 36.0 - 46.0 % Final   Pt had one PNV the day before her admission.  The next day, she presented to MAU with painful contractions and BBOW in vagina.  GA by LMP 19.1 weeks, 21.1 weeks by U/S, so dating is uncertain.  Either way, the fetus was deemed nonviable. She progressed to have a SVD of a stillborn fetus with a hemorrhage of ~ 1500cc just prior to delivery.  She did not require a blood transfusion.  Due to painful contractions/labor, this is not likely a case of cervical incompetence.  However, pt is aware that she could be a cerclage candidate in the future.    Physical Exam:  General: alert, cooperative and no distress Lochia: appropriate Uterine Fundus: firm Incision: n/a DVT Evaluation: No evidence of DVT seen on physical exam. Negative Homan's sign.  Discharge Diagnoses: Spontaneous abortion and possible cervical incompetence.  Discharge Information: Date: 09/19/2013 Activity: pelvic rest Diet: routine Medications: PNV and Iron Condition: stable Instructions: refer to practice specific booklet Discharge to: home   Newborn Data: Stillborn female vs SAB Birth Weight: 13.2 oz (374 g) APGAR: 0, 0.  CRESENZO-DISHMAN,Larenzo Caples 09/19/2013, 11:18 AM

## 2013-09-21 ENCOUNTER — Ambulatory Visit (HOSPITAL_COMMUNITY): Admission: RE | Admit: 2013-09-21 | Payer: Medicaid Other | Source: Ambulatory Visit

## 2013-10-04 ENCOUNTER — Encounter: Payer: Medicaid Other | Admitting: Obstetrics and Gynecology

## 2013-12-09 ENCOUNTER — Emergency Department (HOSPITAL_COMMUNITY): Payer: No Typology Code available for payment source

## 2013-12-09 ENCOUNTER — Emergency Department (HOSPITAL_COMMUNITY)
Admission: EM | Admit: 2013-12-09 | Discharge: 2013-12-09 | Disposition: A | Discharge status: Home / Self Care | Payer: No Typology Code available for payment source | Attending: Emergency Medicine | Admitting: Emergency Medicine

## 2013-12-09 ENCOUNTER — Encounter (HOSPITAL_COMMUNITY): Payer: Self-pay | Admitting: Emergency Medicine

## 2013-12-09 DIAGNOSIS — Z87891 Personal history of nicotine dependence: Secondary | ICD-10-CM | POA: Insufficient documentation

## 2013-12-09 DIAGNOSIS — R6889 Other general symptoms and signs: Secondary | ICD-10-CM | POA: Insufficient documentation

## 2013-12-09 DIAGNOSIS — R0989 Other specified symptoms and signs involving the circulatory and respiratory systems: Secondary | ICD-10-CM

## 2013-12-09 NOTE — ED Notes (Signed)
Patient transported to X-ray 

## 2013-12-09 NOTE — Discharge Instructions (Signed)
Return to the ED with any concerns including difficulty breathing or swallowing, vomiting and not able to keep down liquids, decreased level of alertness/lethargy, or any other alarming symptoms °

## 2013-12-09 NOTE — ED Notes (Signed)
She states she has felt something "stuck" in her throat (points at suprasternal notch area), since eating jumbo shrimp this Sunday.  She is in no distress.

## 2013-12-09 NOTE — ED Provider Notes (Signed)
CSN: [id number]     Arrival date & time 12/09/13  1102 History   First MD Initiated Contact with Patient 12/09/13 1126     Chief Complaint  Patient presents with  . Dysphagia     (Consider location/radiation/quality/duration/timing/severity/associated sxs/prior Treatment) HPI Pt presenting with c/o feeling that she has something stuck in her throat. Pt statese that 6 days ago she was eating jumbo shimp.  Doesn't remember specifically if she felt difficulty swallowing a piece of shrimp, but since then she has been feeling that there is something stuck in her throat.  No choking episode.  She has been able to eat and swallow liquids and solids.  She continues to feel the sensation in lower throat today which prompted ED evaluation.  Symptoms are constant, she has not tried anything other than drinking a lot of water. There are no other associated systemic symptoms, there are no other alleviating or modifying factors.   Past Medical History  Diagnosis Date  . Medical history non-contributory    Past Surgical History  Procedure Laterality Date  . No past surgeries    . Tooth extraction     Family History  Problem Relation Age of Onset  . Hypertension Mother    History  Substance Use Topics  . Smoking status: Former Games developer  . Smokeless tobacco: Never Used  . Alcohol Use: No   OB History   Grav Para Term Preterm Abortions TAB SAB Ect Mult Living   1 1             Review of Systems ROS reviewed and all otherwise negative except for mentioned in HPI    Allergies  Review of patient's allergies indicates no known allergies.  Home Medications   Prior to Admission medications   Not on File   BP 127/75  Pulse 87  Temp(Src) 98.1 F (36.7 C) (Oral)  Resp 20  SpO2 97%  LMP 11/16/2013 Vitals reviewed Physical Exam Physical Examination: General appearance - alert, well appearing, and in no distress Mental status - alert, oriented to person, place, and time Eyes - no  conjunctival injection, no scleral icterus Mouth - mucous membranes moist, pharynx normal without lesions Neck - supple, no significant adenopathy Chest - clear to auscultation, no wheezes, rales or rhonchi, symmetric air entry Heart - normal rate, regular rhythm, normal S1, S2, no murmurs, rubs, clicks or gallops Extremities - peripheral pulses normal, no pedal edema, no clubbing or cyanosis Skin - normal coloration and turgor, no rashes  ED Course  Procedures (including critical care time) Labs Review Labs Reviewed - No data to display  Imaging Review Dg Neck Soft Tissue  12/09/2013   CLINICAL DATA:  Foreign body sensation, dysphagia  EXAM: NECK SOFT TISSUES - 1+ VIEW  COMPARISON:  None.  FINDINGS: There is no evidence of retropharyngeal soft tissue swelling or epiglottic enlargement. The cervical airway is unremarkable and no radio-opaque foreign body identified.  IMPRESSION: Negative.   Electronically Signed   By: Natasha Mead M.D.   On: 12/09/2013 12:06     EKG Interpretation None      MDM   Final diagnoses:  Foreign body sensation in throat    Pt presenting with concern for FB in throat.  No drooling or difficulty breathing or swallowing.  Soft tissue neck films normal.  OP clear, low suspicion for infection.   Patient is overall nontoxic and well hydrated in appearance.  Given information for GI followup.  Discharged with strict return precautions.  Pt agreeable with plan.     Ethelda ChickMartha K Linker, MD 12/10/13 [phone]

## 2013-12-26 ENCOUNTER — Encounter (HOSPITAL_COMMUNITY): Payer: Self-pay

## 2013-12-26 ENCOUNTER — Inpatient Hospital Stay (HOSPITAL_COMMUNITY)
Admission: AD | Admit: 2013-12-26 | Discharge: 2013-12-26 | Disposition: A | Discharge status: Home / Self Care | Payer: No Typology Code available for payment source | Source: Ambulatory Visit | Attending: Family Medicine | Admitting: Family Medicine

## 2013-12-26 DIAGNOSIS — Z87891 Personal history of nicotine dependence: Secondary | ICD-10-CM | POA: Insufficient documentation

## 2013-12-26 DIAGNOSIS — Z3201 Encounter for pregnancy test, result positive: Secondary | ICD-10-CM | POA: Diagnosis not present

## 2013-12-26 DIAGNOSIS — Z8249 Family history of ischemic heart disease and other diseases of the circulatory system: Secondary | ICD-10-CM | POA: Diagnosis not present

## 2013-12-26 DIAGNOSIS — Z3200 Encounter for pregnancy test, result unknown: Secondary | ICD-10-CM | POA: Diagnosis present

## 2013-12-26 LAB — POCT PREGNANCY, URINE: Preg Test, Ur: POSITIVE — AB

## 2013-12-26 NOTE — MAU Provider Note (Signed)
Attestation of Attending Supervision of Advanced Practitioner (PA/CNM/NP): Evaluation and management procedures were performed by the Advanced Practitioner under my supervision and collaboration.  I have reviewed the Advanced Practitioner's note and chart, and I agree with the management and plan.  Reva Bores, MD [facility] Attending 12/26/2013 1:11 PM

## 2013-12-26 NOTE — MAU Note (Signed)
Patient states she has had a positive home pregnancy test. Wants confirmation. States she had had a preterm delivery at 21 weeks and was told she needed a cerclage. Denies pain, bleeding, nausea or vomiting.

## 2013-12-26 NOTE — MAU Provider Note (Signed)
  History     CSN: [id number]  Arrival date and time: 12/26/13 1145   None     Chief Complaint  Patient presents with  . Possible Pregnancy   Possible Pregnancy    Pt is a 23 yo G2P0010 at [redacted]w[redacted]d wks IUP here for pregnancy verification letter.  Hx of cervical incompetence with last pregnancy with delivery at 21 weeks.  Desires to get care soon for cerclage placement.  Denies any problems at this visit.    Past Medical History  Diagnosis Date  . Medical history non-contributory     Past Surgical History  Procedure Laterality Date  . No past surgeries    . Tooth extraction      Family History  Problem Relation Age of Onset  . Hypertension Mother   . Hypertension Sister     History  Substance Use Topics  . Smoking status: Former Games developer  . Smokeless tobacco: Never Used  . Alcohol Use: No    Allergies: No Known Allergies  No prescriptions prior to admission    ROS Pertinent information in HPI  Physical Exam   Blood pressure 121/68, pulse 86, temperature 98.8 F (37.1 C), temperature source Oral, resp. rate 16, height 5\' 6"  (1.676 m), weight 81.92 kg (180 lb 9.6 oz), last menstrual period 11/16/2013, SpO2 100.00%.  Physical Exam  Constitutional: She is oriented to person, place, and time. She appears well-developed and well-nourished. No distress.  HENT:  Head: Normocephalic.  Neck: Normal range of motion. Neck supple.  Cardiovascular: Normal rate, regular rhythm and normal heart sounds.   Respiratory: Effort normal and breath sounds normal. No respiratory distress.  Musculoskeletal: Normal range of motion. She exhibits no edema.  Neurological: She is alert and oriented to person, place, and time. She has normal reflexes.  Skin: Skin is warm and dry.    MAU Course  Procedures Results for orders placed during the hospital encounter of 12/26/13 (from the past 24 hour(s))  POCT PREGNANCY, URINE     Status: Abnormal   Collection Time    12/26/13 12:25 PM     Result Value Ref Range   Preg Test, Ur POSITIVE (*) NEGATIVE    Assessment and Plan  Positive Pregnancy Test  Plan: Pregnancy verification letter given Message routed to [facility] staff for appt request.   Melissa Noon 12/26/2013, 12:47 PM

## 2014-02-01 ENCOUNTER — Ambulatory Visit (INDEPENDENT_AMBULATORY_CARE_PROVIDER_SITE_OTHER): Payer: No Typology Code available for payment source | Admitting: Family Medicine

## 2014-02-01 ENCOUNTER — Encounter: Payer: Self-pay | Admitting: Family Medicine

## 2014-02-01 VITALS — BP 120/77 | HR 107 | Wt 175.8 lb

## 2014-02-01 DIAGNOSIS — O360990 Maternal care for other rhesus isoimmunization, unspecified trimester, not applicable or unspecified: Secondary | ICD-10-CM

## 2014-02-01 DIAGNOSIS — O3431 Maternal care for cervical incompetence, first trimester: Secondary | ICD-10-CM

## 2014-02-01 DIAGNOSIS — O0990 Supervision of high risk pregnancy, unspecified, unspecified trimester: Secondary | ICD-10-CM

## 2014-02-01 DIAGNOSIS — O26899 Other specified pregnancy related conditions, unspecified trimester: Secondary | ICD-10-CM

## 2014-02-01 DIAGNOSIS — O360111 Maternal care for anti-D [Rh] antibodies, first trimester, fetus 1: Secondary | ICD-10-CM

## 2014-02-01 DIAGNOSIS — Z6791 Unspecified blood type, Rh negative: Secondary | ICD-10-CM | POA: Insufficient documentation

## 2014-02-01 DIAGNOSIS — O3090 Multiple gestation, unspecified, unspecified trimester: Secondary | ICD-10-CM

## 2014-02-01 DIAGNOSIS — O3430 Maternal care for cervical incompetence, unspecified trimester: Secondary | ICD-10-CM

## 2014-02-01 DIAGNOSIS — O09212 Supervision of pregnancy with history of pre-term labor, second trimester: Secondary | ICD-10-CM

## 2014-02-01 DIAGNOSIS — O09219 Supervision of pregnancy with history of pre-term labor, unspecified trimester: Secondary | ICD-10-CM

## 2014-02-01 LAB — POCT URINALYSIS DIP (DEVICE)
Bilirubin Urine: NEGATIVE
Glucose, UA: NEGATIVE mg/dL
Hgb urine dipstick: NEGATIVE
Ketones, ur: NEGATIVE mg/dL
LEUKOCYTES UA: NEGATIVE
Nitrite: NEGATIVE
PH: 8.5 — AB (ref 5.0–8.0)
PROTEIN: NEGATIVE mg/dL
Specific Gravity, Urine: 1.015 (ref 1.005–1.030)
UROBILINOGEN UA: 1 mg/dL (ref 0.0–1.0)

## 2014-02-01 NOTE — Patient Instructions (Signed)
Pregnancy - First Trimester During sexual intercourse, millions of sperm go into the vagina. Only 1 sperm will penetrate and fertilize the female egg while it is in the Fallopian tube. One week later, the fertilized egg implants into the wall of the uterus. An embryo begins to develop into a baby. At 6 to 8 weeks, the eyes and face are formed and the heartbeat can be seen on ultrasound. At the end of 12 weeks (first trimester), all the baby's organs are formed. Now that you are pregnant, you will want to do everything you can to have a healthy baby. Two of the most important things are to get good prenatal care and follow your caregiver's instructions. Prenatal care is all the medical care you receive before the baby's birth. It is given to prevent, find, and treat problems during the pregnancy and childbirth. PRENATAL EXAMS  During prenatal visits, your weight, blood pressure, and urine are checked. This is done to make sure you are healthy and progressing normally during the pregnancy.  A pregnant woman should gain 25 to 35 pounds during the pregnancy. However, if you are overweight or underweight, your caregiver will advise you regarding your weight.  Your caregiver will ask and answer questions for you.  Blood work, cervical cultures, other necessary tests, and a Pap test are done during your prenatal exams. These tests are done to check on your health and the probable health of your baby. Tests are strongly recommended and done for HIV with your permission. This is the virus that causes AIDS. These tests are done because medicines can be given to help prevent your baby from being born with this infection should you have been infected without knowing it. Blood work is also used to find out your blood type, previous infections, and follow your blood levels (hemoglobin).  Low hemoglobin (anemia) is common during pregnancy. Iron and vitamins are given to help prevent this. Later in the pregnancy,  blood tests for diabetes will be done along with any other tests if any problems develop.  You may need other tests to make sure you and the baby are doing well. CHANGES DURING THE FIRST TRIMESTER  Your body goes through many changes during pregnancy. They vary from person to person. Talk to your caregiver about changes you notice and are concerned about. Changes can include:  Your menstrual period stops.  The egg and sperm carry the genes that determine what you look like. Genes from you and your partner are forming a baby. The female genes determine whether the baby is a boy or a girl.  Your body increases in girth and you may feel bloated.  Feeling sick to your stomach (nauseous) and throwing up (vomiting). If the vomiting is uncontrollable, call your caregiver.  Your breasts will begin to enlarge and become tender.  Your nipples may stick out more and become darker.  The need to urinate more. Painful urination may mean you have a bladder infection.  Tiring easily.  Loss of appetite.  Cravings for certain kinds of food.  At first, you may gain or lose a couple of pounds.  You may have changes in your emotions from day to day (excited to be pregnant or concerned something may go wrong with the pregnancy and baby).  You may have more vivid and strange dreams. HOME CARE INSTRUCTIONS   It is very important to avoid all smoking, alcohol and non-prescribed drugs during your pregnancy. These affect the formation and growth of the baby.   Avoid chemicals while pregnant to ensure the delivery of a healthy infant.  Start your prenatal visits by the 12th week of pregnancy. They are usually scheduled monthly at first, then more often in the last 2 months before delivery. Keep your caregiver's appointments. Follow your caregiver's instructions regarding medicine use, blood and lab tests, exercise, and diet.  During pregnancy, you are providing food for you and your baby. Eat regular,  well-balanced meals. Choose foods such as meat, fish, milk and other low fat dairy products, vegetables, fruits, and whole-grain breads and cereals. Your caregiver will tell you of the ideal weight gain.  You can help morning sickness by keeping soda crackers at the bedside. Eat a couple before arising in the morning. You may want to use the crackers without salt on them.  Eating 4 to 5 small meals rather than 3 large meals a day also may help the nausea and vomiting.  Drinking liquids between meals instead of during meals also seems to help nausea and vomiting.  A physical sexual relationship may be continued throughout pregnancy if there are no other problems. Problems may be early (premature) leaking of amniotic fluid from the membranes, vaginal bleeding, or belly (abdominal) pain.  Exercise regularly if there are no restrictions. Check with your caregiver or physical therapist if you are unsure of the safety of some of your exercises. Greater weight gain will occur in the last 2 trimesters of pregnancy. Exercising will help:  Control your weight.  Keep you in shape.  Prepare you for labor and delivery.  Help you lose your pregnancy weight after you deliver your baby.  Wear a good support or jogging bra for breast tenderness during pregnancy. This may help if worn during sleep too.  Ask when prenatal classes are available. Begin classes when they are offered.  Do not use hot tubs, steam rooms, or saunas.  Wear your seat belt when driving. This protects you and your baby if you are in an accident.  Avoid raw meat, uncooked cheese, cat litter boxes, and soil used by cats throughout the pregnancy. These carry germs that can cause birth defects in the baby.  The first trimester is a good time to visit your dentist for your dental health. Getting your teeth cleaned is okay. Use a softer toothbrush and brush gently during pregnancy.  Ask for help if you have financial, counseling, or  nutritional needs during pregnancy. Your caregiver will be able to offer counseling for these needs as well as refer you for other special needs.  Do not take any medicines or herbs unless told by your caregiver.  Inform your caregiver if there is any mental or physical domestic violence.  Make a list of emergency phone numbers of family, friends, hospital, and police and fire departments.  Write down your questions. Take them to your prenatal visit.  Do not douche.  Do not cross your legs.  If you have to stand for long periods of time, rotate you feet or take small steps in a circle.  You may have more vaginal secretions that may require a sanitary pad. Do not use tampons or scented sanitary pads. MEDICINES AND DRUG USE IN PREGNANCY  Take prenatal vitamins as directed. The vitamin should contain 1 milligram of folic acid. Keep all vitamins out of reach of children. Only a couple vitamins or tablets containing iron may be fatal to a baby or young child when ingested.  Avoid use of all medicines, including herbs, over-the-counter medicines, not   prescribed or suggested by your caregiver. Only take over-the-counter or prescription medicines for pain, discomfort, or fever as directed by your caregiver. Do not use aspirin, ibuprofen, or naproxen unless directed by your caregiver.  Let your caregiver also know about herbs you may be using.  Alcohol is related to a number of birth defects. This includes fetal alcohol syndrome. All alcohol, in any form, should be avoided completely. Smoking will cause low birth rate and premature babies.  Street or illegal drugs are very harmful to the baby. They are absolutely forbidden. A baby born to an addicted mother will be addicted at birth. The baby will go through the same withdrawal an adult does.  Let your caregiver know about any medicines that you have to take and for what reason you take them. SEEK MEDICAL CARE IF:  You have any concerns or  worries during your pregnancy. It is better to call with your questions if you feel they cannot wait, rather than worry about them. SEEK IMMEDIATE MEDICAL CARE IF:   An unexplained oral temperature above 102 F (38.9 C) develops, or as your caregiver suggests.  You have leaking of fluid from the vagina (birth canal). If leaking membranes are suspected, take your temperature and inform your caregiver of this when you call.  There is vaginal spotting or bleeding. Notify your caregiver of the amount and how many pads are used.  You develop a bad smelling vaginal discharge with a change in the color.  You continue to feel sick to your stomach (nauseated) and have no relief from remedies suggested. You vomit blood or coffee ground-like materials.  You lose more than 2 pounds of weight in 1 week.  You gain more than 2 pounds of weight in 1 week and you notice swelling of your face, hands, feet, or legs.  You gain 5 pounds or more in 1 week (even if you do not have swelling of your hands, face, legs, or feet).  You get exposed to German measles and have never had them.  You are exposed to fifth disease or chickenpox.  You develop belly (abdominal) pain. Round ligament discomfort is a common non-cancerous (benign) cause of abdominal pain in pregnancy. Your caregiver still must evaluate this.  You develop headache, fever, diarrhea, pain with urination, or shortness of breath.  You fall or are in a car accident or have any kind of trauma.  There is mental or physical violence in your home. Document Released: 06/30/2001 Document Revised: 03/30/2012 Document Reviewed: 05/16/2013 [employer] Patient Information 2015 [employer]. This information is not intended to replace advice given to you by your health care provider. Make sure you discuss any questions you have with your health care provider.  Breastfeeding Deciding to breastfeed is one of the best choices you can make for you and your  baby. A change in hormones during pregnancy causes your breast tissue to grow and increases the number and size of your milk ducts. These hormones also allow proteins, sugars, and fats from your blood supply to make breast milk in your milk-producing glands. Hormones prevent breast milk from being released before your baby is born as well as prompt milk flow after birth. Once breastfeeding has begun, thoughts of your baby, as well as his or her sucking or crying, can stimulate the release of milk from your milk-producing glands.  BENEFITS OF BREASTFEEDING For Your Baby  Your first milk (colostrum) helps your baby's digestive system function better.   There are antibodies in   your milk that help your baby fight off infections.   Your baby has a lower incidence of asthma, allergies, and sudden infant death syndrome.   The nutrients in breast milk are better for your baby than infant formulas and are designed uniquely for your baby's needs.   Breast milk improves your baby's brain development.   Your baby is less likely to develop other conditions, such as childhood obesity, asthma, or type 2 diabetes mellitus.  For You   Breastfeeding helps to create a very special bond between you and your baby.   Breastfeeding is convenient. Breast milk is always available at the correct temperature and costs nothing.   Breastfeeding helps to burn calories and helps you lose the weight gained during pregnancy.   Breastfeeding makes your uterus contract to its prepregnancy size faster and slows bleeding (lochia) after you give birth.   Breastfeeding helps to lower your risk of developing type 2 diabetes mellitus, osteoporosis, and breast or ovarian cancer later in life. SIGNS THAT YOUR BABY IS HUNGRY Early Signs of Hunger  Increased alertness or activity.  Stretching.  Movement of the head from side to side.  Movement of the head and opening of the mouth when the corner of the mouth or  cheek is stroked (rooting).  Increased sucking sounds, smacking lips, cooing, sighing, or squeaking.  Hand-to-mouth movements.  Increased sucking of fingers or hands. Late Signs of Hunger  Fussing.  Intermittent crying. Extreme Signs of Hunger Signs of extreme hunger will require calming and consoling before your baby will be able to breastfeed successfully. Do not wait for the following signs of extreme hunger to occur before you initiate breastfeeding:   Restlessness.  A loud, strong cry.   Screaming. BREASTFEEDING BASICS Breastfeeding Initiation  Find a comfortable place to sit or lie down, with your neck and back well supported.  Place a pillow or rolled up blanket under your baby to bring him or her to the level of your breast (if you are seated). Nursing pillows are specially designed to help support your arms and your baby while you breastfeed.  Make sure that your baby's abdomen is facing your abdomen.   Gently massage your breast. With your fingertips, massage from your chest wall toward your nipple in a circular motion. This encourages milk flow. You may need to continue this action during the feeding if your milk flows slowly.  Support your breast with 4 fingers underneath and your thumb above your nipple. Make sure your fingers are well away from your nipple and your baby's mouth.   Stroke your baby's lips gently with your finger or nipple.   When your baby's mouth is open wide enough, quickly bring your baby to your breast, placing your entire nipple and as much of the colored area around your nipple (areola) as possible into your baby's mouth.   More areola should be visible above your baby's upper lip than below the lower lip.   Your baby's tongue should be between his or her lower gum and your breast.   Ensure that your baby's mouth is correctly positioned around your nipple (latched). Your baby's lips should create a seal on your breast and be turned  out (everted).  It is common for your baby to suck about 2-3 minutes in order to start the flow of breast milk. Latching Teaching your baby how to latch on to your breast properly is very important. An improper latch can cause nipple pain and decreased milk supply   for you and poor weight gain in your baby. Also, if your baby is not latched onto your nipple properly, he or she may swallow some air during feeding. This can make your baby fussy. Burping your baby when you switch breasts during the feeding can help to get rid of the air. However, teaching your baby to latch on properly is still the best way to prevent fussiness from swallowing air while breastfeeding. Signs that your baby has successfully latched on to your nipple:    Silent tugging or silent sucking, without causing you pain.   Swallowing heard between every 3-4 sucks.    Muscle movement above and in front of his or her ears while sucking.  Signs that your baby has not successfully latched on to nipple:   Sucking sounds or smacking sounds from your baby while breastfeeding.  Nipple pain. If you think your baby has not latched on correctly, slip your finger into the corner of your baby's mouth to break the suction and place it between your baby's gums. Attempt breastfeeding initiation again. Signs of Successful Breastfeeding Signs from your baby:   A gradual decrease in the number of sucks or complete cessation of sucking.   Falling asleep.   Relaxation of his or her body.   Retention of a small amount of milk in his or her mouth.   Letting go of your breast by himself or herself. Signs from you:  Breasts that have increased in firmness, weight, and size 1-3 hours after feeding.   Breasts that are softer immediately after breastfeeding.  Increased milk volume, as well as a change in milk consistency and color by the fifth day of breastfeeding.   Nipples that are not sore, cracked, or bleeding. Signs That  Your Baby is Getting Enough Milk  Wetting at least 3 diapers in a 24-hour period. The urine should be clear and pale yellow by age 5 days.  At least 3 stools in a 24-hour period by age 5 days. The stool should be soft and yellow.  At least 3 stools in a 24-hour period by age 7 days. The stool should be seedy and yellow.  No loss of weight greater than 10% of birth weight during the first 3 days of age.  Average weight gain of 4-7 ounces (113-198 g) per week after age 4 days.  Consistent daily weight gain by age 5 days, without weight loss after the age of 2 weeks. After a feeding, your baby may spit up a small amount. This is common. BREASTFEEDING FREQUENCY AND DURATION Frequent feeding will help you make more milk and can prevent sore nipples and breast engorgement. Breastfeed when you feel the need to reduce the fullness of your breasts or when your baby shows signs of hunger. This is called "breastfeeding on demand." Avoid introducing a pacifier to your baby while you are working to establish breastfeeding (the first 4-6 weeks after your baby is born). After this time you may choose to use a pacifier. Research has shown that pacifier use during the first year of a baby's life decreases the risk of sudden infant death syndrome (SIDS). Allow your baby to feed on each breast as long as he or she wants. Breastfeed until your baby is finished feeding. When your baby unlatches or falls asleep while feeding from the first breast, offer the second breast. Because newborns are often sleepy in the first few weeks of life, you may need to awaken your baby to get him or   her to feed. Breastfeeding times will vary from baby to baby. However, the following rules can serve as a guide to help you ensure that your baby is properly fed:  Newborns (babies 4 weeks of age or younger) may breastfeed every 1-3 hours.  Newborns should not go longer than 3 hours during the day or 5 hours during the night without  breastfeeding.  You should breastfeed your baby a minimum of 8 times in a 24-hour period until you begin to introduce solid foods to your baby at around 6 months of age. BREAST MILK PUMPING Pumping and storing breast milk allows you to ensure that your baby is exclusively fed your breast milk, even at times when you are unable to breastfeed. This is especially important if you are going back to work while you are still breastfeeding or when you are not able to be present during feedings. Your lactation consultant can give you guidelines on how long it is safe to store breast milk.  A breast pump is a machine that allows you to pump milk from your breast into a sterile bottle. The pumped breast milk can then be stored in a refrigerator or freezer. Some breast pumps are operated by hand, while others use electricity. Ask your lactation consultant which type will work best for you. Breast pumps can be purchased, but some hospitals and breastfeeding support groups lease breast pumps on a monthly basis. A lactation consultant can teach you how to hand express breast milk, if you prefer not to use a pump.  CARING FOR YOUR BREASTS WHILE YOU BREASTFEED Nipples can become dry, cracked, and sore while breastfeeding. The following recommendations can help keep your breasts moisturized and healthy:  Avoid using soap on your nipples.   Wear a supportive bra. Although not required, special nursing bras and tank tops are designed to allow access to your breasts for breastfeeding without taking off your entire bra or top. Avoid wearing underwire-style bras or extremely tight bras.  Air dry your nipples for 3-4minutes after each feeding.   Use only cotton bra pads to absorb leaked breast milk. Leaking of breast milk between feedings is normal.   Use lanolin on your nipples after breastfeeding. Lanolin helps to maintain your skin's normal moisture barrier. If you use pure lanolin, you do not need to wash it off  before feeding your baby again. Pure lanolin is not toxic to your baby. You may also hand express a few drops of breast milk and gently massage that milk into your nipples and allow the milk to air dry. In the first few weeks after giving birth, some women experience extremely full breasts (engorgement). Engorgement can make your breasts feel heavy, warm, and tender to the touch. Engorgement peaks within 3-5 days after you give birth. The following recommendations can help ease engorgement:  Completely empty your breasts while breastfeeding or pumping. You may want to start by applying warm, moist heat (in the shower or with warm water-soaked hand towels) just before feeding or pumping. This increases circulation and helps the milk flow. If your baby does not completely empty your breasts while breastfeeding, pump any extra milk after he or she is finished.  Wear a snug bra (nursing or regular) or tank top for 1-2 days to signal your body to slightly decrease milk production.  Apply ice packs to your breasts, unless this is too uncomfortable for you.  Make sure that your baby is latched on and positioned properly while breastfeeding. If engorgement   persists after 48 hours of following these recommendations, contact your health care provider or a lactation consultant. OVERALL HEALTH CARE RECOMMENDATIONS WHILE BREASTFEEDING  Eat healthy foods. Alternate between meals and snacks, eating 3 of each per day. Because what you eat affects your breast milk, some of the foods may make your baby more irritable than usual. Avoid eating these foods if you are sure that they are negatively affecting your baby.  Drink milk, fruit juice, and water to satisfy your thirst (about 10 glasses a day).   Rest often, relax, and continue to take your prenatal vitamins to prevent fatigue, stress, and anemia.  Continue breast self-awareness checks.  Avoid chewing and smoking tobacco.  Avoid alcohol and drug use. Some  medicines that may be harmful to your baby can pass through breast milk. It is important to ask your health care provider before taking any medicine, including all over-the-counter and prescription medicine as well as vitamin and herbal supplements. It is possible to become pregnant while breastfeeding. If birth control is desired, ask your health care provider about options that will be safe for your baby. SEEK MEDICAL CARE IF:   You feel like you want to stop breastfeeding or have become frustrated with breastfeeding.  You have painful breasts or nipples.  Your nipples are cracked or bleeding.  Your breasts are red, tender, or warm.  You have a swollen area on either breast.  You have a fever or chills.  You have nausea or vomiting.  You have drainage other than breast milk from your nipples.  Your breasts do not become full before feedings by the fifth day after you give birth.  You feel sad and depressed.  Your baby is too sleepy to eat well.  Your baby is having trouble sleeping.   Your baby is wetting less than 3 diapers in a 24-hour period.  Your baby has less than 3 stools in a 24-hour period.  Your baby's skin or the white part of his or her eyes becomes yellow.   Your baby is not gaining weight by 5 days of age. SEEK IMMEDIATE MEDICAL CARE IF:   Your baby is overly tired (lethargic) and does not want to wake up and feed.  Your baby develops an unexplained fever. Document Released: 07/06/2005 Document Revised: 07/11/2013 Document Reviewed: 12/28/2012 [employer] Patient Information 2015 [employer]. This information is not intended to replace advice given to you by your health care provider. Make sure you discuss any questions you have with your health care provider.  

## 2014-02-01 NOTE — Progress Notes (Signed)
Fetal Translucency scheduled for July 27 @ 1130

## 2014-02-01 NOTE — Progress Notes (Signed)
   Subjective:    Rhonda Gordon is a G2P0100 1089w0d being seen today for her first obstetrical visit.  Her obstetrical history is significant for incompetent cervix.  Pregnancy history fully reviewed.  Patient reports no complaints.  Filed Vitals:   02/01/14 1025  BP: 120/77  Pulse: 107  Weight: 175 lb 12.8 oz (79.742 kg)    HISTORY: OB History  Gravida Para Term Preterm AB SAB TAB Ectopic Multiple Living  2 1  1       0    # Outcome Date GA Lbr Len/2nd Weight Sex Delivery Anes PTL Lv  2 CUR           1 PRE 09/15/13 6554w1d -15:11 / 00:03 13.2 oz (0.374 kg) M SVD None  SB     Past Medical History  Diagnosis Date  . Medical history non-contributory    Past Surgical History  Procedure Laterality Date  . No past surgeries    . Tooth extraction     Family History  Problem Relation Age of Onset  . Hypertension Mother   . Hypertension Sister      Exam    Uterus:   12 wk size  Pelvic Exam:    Perineum: Normal Perineum   Vulva: Bartholin's, Urethra, Skene's normal   Vagina:  normal mucosa, normal discharge   Cervix: multiparous appearance and soft, thin ftp   Adnexa: normal adnexa   Bony Pelvis: average   Skin: normal coloration and turgor, no rashes    Neurologic: oriented   Extremities: normal strength, tone, and muscle mass   HEENT sclera clear, anicteric   Mouth/Teeth mucous membranes moist, pharynx normal without lesions, normal dentition   Neck supple   Cardiovascular: regular rate and rhythm, no murmurs or gallops   Respiratory:  appears well, vitals normal, no respiratory distress, acyanotic, normal RR, ear and throat exam is normal, neck free of mass or lymphadenopathy, chest clear, no wheezing, crepitations, rhonchi, normal symmetric air entry   Abdomen: soft, non-tender; bowel sounds normal; no masses,  no organomegaly      Assessment:    Pregnancy: G2P0100 Patient Active Problem List   Diagnosis Date Noted  . Supervision of high-risk pregnancy  02/01/2014    Priority: High  . Incompetent cervix in pregnancy, antepartum 02/01/2014    Priority: Medium  . Rh negative state in antepartum period 02/01/2014    Priority: Medium        Plan:     Initial labs drawn. Prenatal vitamins. Problem list reviewed and updated. Genetic Screening discussed First Screen: ordered.  Ultrasound discussed; fetal survey: discussed.  Follow up in 4 weeks. Schedule cervical cerclage  PRATT,TANYA S 02/01/2014

## 2014-02-01 NOTE — Progress Notes (Signed)
Nutrition note: 1st visit consult Pt has gained 5.8# @ 11w, which is slightly > expected. Pt reports eating 3-4x/d. Pt is taking a PNV. Pt reports some N/V but no heartburn. Pt received verbal & written education on general nutrition during pregnancy. Discussed tips to decrease N/V. Discussed wt gain goals of 15-25# or 0.6#/wk.  Pt agrees to continue taking a PNV. Pt does not have WIC but plans to apply. Pt plans to BF. F/u as needed Blondell RevealLaura Lesley Atkin, MS, RD, LDN, [facility]CLC

## 2014-02-02 ENCOUNTER — Other Ambulatory Visit: Payer: Self-pay | Admitting: Family Medicine

## 2014-02-02 DIAGNOSIS — Z3682 Encounter for antenatal screening for nuchal translucency: Secondary | ICD-10-CM

## 2014-02-02 LAB — OBSTETRIC PANEL
Antibody Screen: NEGATIVE
BASOS ABS: 0 10*3/uL (ref 0.0–0.1)
Basophils Relative: 0 % (ref 0–1)
EOS ABS: 0 10*3/uL (ref 0.0–0.7)
Eosinophils Relative: 1 % (ref 0–5)
HCT: 38.4 % (ref 36.0–46.0)
HEP B S AG: NEGATIVE
Hemoglobin: 13.4 g/dL (ref 12.0–15.0)
LYMPHS ABS: 1.4 10*3/uL (ref 0.7–4.0)
Lymphocytes Relative: 32 % (ref 12–46)
MCH: 28.2 pg (ref 26.0–34.0)
MCHC: 34.9 g/dL (ref 30.0–36.0)
MCV: 80.7 fL (ref 78.0–100.0)
Monocytes Absolute: 0.3 10*3/uL (ref 0.1–1.0)
Monocytes Relative: 7 % (ref 3–12)
NEUTROS PCT: 60 % (ref 43–77)
Neutro Abs: 2.7 10*3/uL (ref 1.7–7.7)
PLATELETS: 260 10*3/uL (ref 150–400)
RBC: 4.76 MIL/uL (ref 3.87–5.11)
RDW: 16.7 % — ABNORMAL HIGH (ref 11.5–15.5)
Rh Type: NEGATIVE
Rubella: 3.09 Index — ABNORMAL HIGH (ref <0.90)
WBC: 4.5 10*3/uL (ref 4.0–10.5)

## 2014-02-02 LAB — HIV ANTIBODY (ROUTINE TESTING W REFLEX): HIV 1&2 Ab, 4th Generation: NONREACTIVE

## 2014-02-03 LAB — PRESCRIPTION MONITORING PROFILE (19 PANEL)
AMPHETAMINE/METH: NEGATIVE ng/mL
Barbiturate Screen, Urine: NEGATIVE ng/mL
Benzodiazepine Screen, Urine: NEGATIVE ng/mL
Buprenorphine, Urine: NEGATIVE ng/mL
CANNABINOID SCRN UR: NEGATIVE ng/mL
CARISOPRODOL, URINE: NEGATIVE ng/mL
COCAINE METABOLITES: NEGATIVE ng/mL
Creatinine, Urine: 181.04 mg/dL (ref >20.0)
FENTANYL URINE: NEGATIVE ng/mL
MDMA URINE: NEGATIVE ng/mL
METHADONE SCREEN, URINE: NEGATIVE ng/mL
METHAQUALONE SCREEN (URINE): NEGATIVE ng/mL
Meperidine, Ur: NEGATIVE ng/mL
Nitrites, Initial: NEGATIVE ug/mL
Opiate Screen, Urine: NEGATIVE ng/mL
Oxycodone Screen, Ur: NEGATIVE ng/mL
PHENCYCLIDINE, UR: NEGATIVE ng/mL
Propoxyphene: NEGATIVE ng/mL
Tapentadol, urine: NEGATIVE ng/mL
Tramadol Scrn, Ur: NEGATIVE ng/mL
Zolpidem, Urine: NEGATIVE ng/mL
pH, Initial: 8 pH (ref 4.5–8.9)

## 2014-02-03 LAB — CULTURE, OB URINE

## 2014-02-03 LAB — GC/CHLAMYDIA PROBE AMP
CT PROBE, AMP APTIMA: NEGATIVE
GC Probe RNA: NEGATIVE

## 2014-02-07 ENCOUNTER — Encounter (HOSPITAL_COMMUNITY): Payer: Self-pay | Admitting: *Deleted

## 2014-02-12 ENCOUNTER — Other Ambulatory Visit (HOSPITAL_COMMUNITY): Payer: No Typology Code available for payment source

## 2014-02-14 NOTE — H&P (Signed)
[place] Leo RodMoreland is a 23 y.o. female G2P0100 at 4313 weeks presenting for scheduled prophylactic cerclage placement. Patient has a history of incompetent cervix resulting in a 21 week loss. Patient is currently doing well and is without complaints.  History OB History   Grav Para Term Preterm Abortions TAB SAB Ect Mult Living   2 1  1       0     Past Medical History  Diagnosis Date  . Medical history non-contributory   . SVD (spontaneous vaginal delivery) 08/2013    svd - fetal loss at 27 wks   Past Surgical History  Procedure Laterality Date  . Tooth extraction     Family History: family history includes Hypertension in her mother and sister. Social History:  reports that she has never smoked. She has never used smokeless tobacco. She reports that she does not drink alcohol or use illicit drugs.    Review of Systems  All other systems reviewed and are negative.     Blood pressure 119/55, pulse 97, temperature 99 F (37.2 C), temperature source Oral, resp. rate 20, height 5\' 6"  (1.676 m), weight 175 lb (79.379 kg), last menstrual period 11/16/2013, SpO2 100.00%. Exam Physical Exam  GENERAL: Well-developed, well-nourished female in no acute distress.  HEENT: Normocephalic, atraumatic. Sclerae anicteric.  NECK: Supple. Normal thyroid.  LUNGS: Clear to auscultation bilaterally.  HEART: Regular rate and rhythm. ABDOMEN: Soft, nontender, nondistended. No organomegaly. PELVIC: Deferred to OR EXTREMITIES: No cyanosis, clubbing, or edema, 2+ distal pulses.  Prenatal labs: ABO, Rh: A/NEG/-- (07/16 1235) Antibody: NEG (07/16 1235) Rubella: 3.09 (07/16 1235) RPR: NON REAC (07/16 1235)  HBsAg: NEGATIVE (07/16 1235)  HIV: NONREACTIVE (07/16 1235)  GBS:     Assessment/Plan: 23 yo G2P0100 at 13 weeks with history of incompetent cervix resulting in a 21 week loss here for prophylactic cerclage placement - Risks, benefits and alternatives were explained including but not limited to  risks of bleeding, infection, and premature rupture of membrane. Patient verbalized understanding and all questions were answered.    Allah Reason 02/15/2014, 8:41 AM

## 2014-02-15 ENCOUNTER — Ambulatory Visit (HOSPITAL_COMMUNITY)
Admission: RE | Admit: 2014-02-15 | Discharge: 2014-02-15 | Disposition: A | Discharge status: Home / Self Care | Payer: No Typology Code available for payment source | Source: Ambulatory Visit | Attending: Obstetrics and Gynecology | Admitting: Obstetrics and Gynecology

## 2014-02-15 ENCOUNTER — Encounter (HOSPITAL_COMMUNITY)
Admission: RE | Disposition: A | Discharge status: Home / Self Care | Payer: Self-pay | Source: Ambulatory Visit | Attending: Obstetrics and Gynecology

## 2014-02-15 ENCOUNTER — Ambulatory Visit (HOSPITAL_COMMUNITY): Payer: No Typology Code available for payment source | Admitting: Anesthesiology

## 2014-02-15 ENCOUNTER — Encounter (HOSPITAL_COMMUNITY): Payer: No Typology Code available for payment source | Admitting: Anesthesiology

## 2014-02-15 ENCOUNTER — Encounter (HOSPITAL_COMMUNITY): Payer: Self-pay | Admitting: *Deleted

## 2014-02-15 DIAGNOSIS — O3430 Maternal care for cervical incompetence, unspecified trimester: Secondary | ICD-10-CM

## 2014-02-15 DIAGNOSIS — O3432 Maternal care for cervical incompetence, second trimester: Secondary | ICD-10-CM

## 2014-02-15 HISTORY — PX: CERVICAL CERCLAGE: SHX1329

## 2014-02-15 LAB — CBC
HEMATOCRIT: 36.8 % (ref 36.0–46.0)
Hemoglobin: 12.8 g/dL (ref 12.0–15.0)
MCH: 29.4 pg (ref 26.0–34.0)
MCHC: 34.8 g/dL (ref 30.0–36.0)
MCV: 84.4 fL (ref 78.0–100.0)
Platelets: 235 10*3/uL (ref 150–400)
RBC: 4.36 MIL/uL (ref 3.87–5.11)
RDW: 14.4 % (ref 11.5–15.5)
WBC: 5.4 10*3/uL (ref 4.0–10.5)

## 2014-02-15 SURGERY — CERCLAGE, CERVIX, VAGINAL APPROACH
Anesthesia: Spinal | Site: Vagina

## 2014-02-15 MED ORDER — DEXAMETHASONE SODIUM PHOSPHATE 10 MG/ML IJ SOLN
INTRAMUSCULAR | Status: AC
  Filled 2014-02-15: qty 1

## 2014-02-15 MED ORDER — PROPOFOL 10 MG/ML IV EMUL
INTRAVENOUS | Status: AC
  Filled 2014-02-15: qty 20

## 2014-02-15 MED ORDER — KETOROLAC TROMETHAMINE 30 MG/ML IJ SOLN: 15.0000 mg | Freq: Once | INTRAMUSCULAR | Status: DC | PRN

## 2014-02-15 MED ORDER — MEPERIDINE HCL 25 MG/ML IJ SOLN: 6.2500 mg | INTRAMUSCULAR | Status: DC | PRN

## 2014-02-15 MED ORDER — LIDOCAINE HCL 1 % IJ SOLN
INTRAMUSCULAR | Status: AC
Start: 2014-02-15 — End: 2014-02-15
  Filled 2014-02-15: qty 20

## 2014-02-15 MED ORDER — FENTANYL CITRATE 0.05 MG/ML IJ SOLN: 25.0000 ug | INTRAMUSCULAR | Status: DC | PRN

## 2014-02-15 MED ORDER — DOCUSATE SODIUM 100 MG PO CAPS: 100.0000 mg | ORAL_CAPSULE | Freq: Two times a day (BID) | ORAL | Status: DC | PRN

## 2014-02-15 MED ORDER — METOCLOPRAMIDE HCL 5 MG/ML IJ SOLN: 10.0000 mg | Freq: Once | INTRAMUSCULAR | Status: DC | PRN

## 2014-02-15 MED ORDER — SCOPOLAMINE 1 MG/3DAYS TD PT72
1.0000 | MEDICATED_PATCH | Freq: Once | TRANSDERMAL | Status: DC
  Administered 2014-02-15: 1.5 mg via TRANSDERMAL

## 2014-02-15 MED ORDER — FENTANYL CITRATE 0.05 MG/ML IJ SOLN
INTRAMUSCULAR | Status: AC
  Filled 2014-02-15: qty 2

## 2014-02-15 MED ORDER — LACTATED RINGERS IV SOLN
INTRAVENOUS | Status: DC
  Administered 2014-02-15 (×2): via INTRAVENOUS

## 2014-02-15 MED ORDER — DEXAMETHASONE SODIUM PHOSPHATE 4 MG/ML IJ SOLN
INTRAMUSCULAR | Status: DC | PRN
  Administered 2014-02-15: 4 mg via INTRAVENOUS

## 2014-02-15 MED ORDER — SCOPOLAMINE 1 MG/3DAYS TD PT72
MEDICATED_PATCH | TRANSDERMAL | Status: AC
  Administered 2014-02-15: 1.5 mg via TRANSDERMAL
  Filled 2014-02-15: qty 1

## 2014-02-15 MED ORDER — OXYCODONE-ACETAMINOPHEN 5-325 MG PO TABS: 1.0000 | ORAL_TABLET | ORAL | Status: DC | PRN

## 2014-02-15 MED ORDER — LIDOCAINE IN DEXTROSE 5-7.5 % IV SOLN
INTRAVENOUS | Status: AC
  Filled 2014-02-15: qty 2

## 2014-02-15 MED ORDER — LIDOCAINE IN DEXTROSE 5-7.5 % IV SOLN
INTRAVENOUS | Status: DC | PRN
  Administered 2014-02-15: 1 mL via INTRATHECAL

## 2014-02-15 MED ORDER — LIDOCAINE HCL 1 % IJ SOLN
INTRAMUSCULAR | Status: DC | PRN
  Administered 2014-02-15: 10 mL

## 2014-02-15 SURGICAL SUPPLY — 16 items
CANISTER SUCT 3000ML (MISCELLANEOUS) ×3 IMPLANT
COUNTER NEEDLE 1200 MAGNETIC (NEEDLE) ×3 IMPLANT
GLOVE BIOGEL PI IND STRL 6.5 (GLOVE) ×1 IMPLANT
GLOVE BIOGEL PI INDICATOR 6.5 (GLOVE) ×2
GLOVE SURG SS PI 6.5 STRL IVOR (GLOVE) ×3 IMPLANT
GOWN STRL REUS W/TWL LRG LVL3 (GOWN DISPOSABLE) ×6 IMPLANT
NS IRRIG 1000ML POUR BTL (IV SOLUTION) ×3 IMPLANT
PACK VAGINAL MINOR WOMEN LF (CUSTOM PROCEDURE TRAY) ×3 IMPLANT
PAD OB MATERNITY 4.3X12.25 (PERSONAL CARE ITEMS) ×3 IMPLANT
PAD PREP 24X48 CUFFED NSTRL (MISCELLANEOUS) ×3 IMPLANT
SUT PROLENE 0 CT 1 30 (SUTURE) ×3 IMPLANT
TOWEL OR 17X24 6PK STRL BLUE (TOWEL DISPOSABLE) ×6 IMPLANT
TRAY FOLEY CATH 14FR (SET/KITS/TRAYS/PACK) ×3 IMPLANT
TUBING NON-CON 1/4 X 20 CONN (TUBING) ×2 IMPLANT
TUBING NON-CON 1/4 X 20' CONN (TUBING) ×1
YANKAUER SUCT BULB TIP NO VENT (SUCTIONS) ×3 IMPLANT

## 2014-02-15 NOTE — Anesthesia Preprocedure Evaluation (Signed)
Anesthesia Evaluation  Patient identified by MRN, date of birth, ID band Patient awake    Reviewed: Allergy & Precautions, H&P , NPO status , Patient's Chart, lab work & pertinent test results, reviewed documented beta blocker date and time   History of Anesthesia Complications Negative for: history of anesthetic complications  Airway Mallampati: III TM Distance: >3 FB Neck ROM: full    Dental  (+) Teeth Intact   Pulmonary neg pulmonary ROS,  breath sounds clear to auscultation        Cardiovascular negative cardio ROS  Rhythm:regular Rate:Normal     Neuro/Psych negative neurological ROS  negative psych ROS   GI/Hepatic negative GI ROS, Neg liver ROS,   Endo/Other  negative endocrine ROS  Renal/GU negative Renal ROS     Musculoskeletal   Abdominal   Peds  Hematology negative hematology ROS (+)   Anesthesia Other Findings   Reproductive/Obstetrics (+) Pregnancy (13 weeks, h/o fetal loss)                           Anesthesia Physical Anesthesia Plan  ASA: II  Anesthesia Plan: Spinal   Post-op Pain Management:    Induction:   Airway Management Planned:   Additional Equipment:   Intra-op Plan:   Post-operative Plan:   Informed Consent: I have reviewed the patients History and Physical, chart, labs and discussed the procedure including the risks, benefits and alternatives for the proposed anesthesia with the patient or authorized representative who has indicated his/her understanding and acceptance.     Plan Discussed with: Surgeon and CRNA  Anesthesia Plan Comments:         Anesthesia Quick Evaluation

## 2014-02-15 NOTE — Op Note (Signed)
Rhonda Gordon   PROCEDURE DATE: 02/15/2014  PREOPERATIVE DIAGNOSIS: Intrauterine pregnancy at 3548w0d, history of cervical incompetence   POSTOPERATIVE DIAGNOSIS: The same PROCEDURE: Transvaginal McDonald Cervical Cerclage Placement SURGEON:  Dr. Catalina AntiguaPeggy Zavion Sleight  INDICATIONS: 23 y.o. G2P0100 at 3348w0d with history of cervical incompetence, here for cerclage placement.   The risks of surgery were discussed in detail with the patient including but not limited to: bleeding; infection which may require antibiotic therapy; injury to cervix, vagina other surrounding organs; risk of ruptured membranes and/or preterm delivery and other postoperative or anesthesia complications.  Written informed consent was obtained.    FINDINGS:  About 2 cm palpable cervical length in the vagina, closed cervix, suture knot placed anteriorly.  ANESTHESIA:  Spinal INTRAVENOUS FLUIDS: 800  ml ESTIMATED BLOOD LOSS: less than 50 ml URINE OUTPUT: 200 ml COMPLICATIONS: None immediate  PROCEDURE IN DETAIL:  The patient had sequential compression devices applied to her lower extremities while in the preoperative area.  Reassuring fetal heart rate was also obtained using a doppler (152 bpm). She was then taken to the operating room where spinal anesthesia was administered and was found to be adequate.  She was placed in the dorsal lithotomy, and was prepped and draped in a sterile manner. Her bladder was catheterized for an unmeasured amount of clear, yellow urine.  After an adequate timeout was performed, a vaginal speculum was then placed in the patient's vagina and a single tooth tenaculum was applied to the anterior lip of the cervix.  A paracervical block using 1% Marcaine was administered.  The anterior and posterior lips of the cervix was grasped with ring forceps. A curved needle loaded with a number 1 Prolene suture was inserted at 12 o'clock, as high as possible at the junction of the rugated vaginal epithelium and the  smooth cervix, at least 2 cm above the external os.  Four bites are taken circumferentially around the entire cervix in a purse-string fashion, each bite should be deep enough to extend at least midway into the cervical stroma, but not into the endocervical canal. The two ends of the suture were then tied securely anteriorly and cut, leaving the ends long enough to grasp with a clamp when it is time to remove it. There was minimal bleeding noted and the ring forceps were removed with good hemostasis noted.  All instruments were removed from the patient's vagina.  Instrument, needle and sponge counts were correct x 2. The patient tolerated the procedure well, and was taken to the recovery area awake and in stable condition. Reassuring fetal heart rate was also obtained using a bedside ultrasound in the operating room.  The patient will be discharged to home as per PACU criteria.  Routine postoperative instructions given.  She was prescribed Percocet and Colace.  She will follow up in the clinic on 03/01/2014 for postoperative evaluation and ongoing prenatal care

## 2014-02-15 NOTE — Discharge Instructions (Signed)
Cervical Cerclage, Care After Refer to this sheet in the next few weeks. These instructions provide you with information on caring for yourself after your procedure. Your health care provider may also give you more specific instructions. Your treatment has been planned according to current medical practices, but problems sometimes occur. Call your health care provider if you have any problems or questions after your procedure. WHAT TO EXPECT AFTER THE PROCEDURE  After your procedure, it is typical to have the following:  Abdominal cramping.  Vaginal spotting. HOME CARE INSTRUCTIONS   Only take over-the-counter or prescription medicines for pain, discomfort, or fever as directed by your health care provider.  Avoid physical activities and exercise until your health care provider says it is okay.  Do not douche or have sexual intercourse until your health care provider tells you it is okay.  Keep your follow-up surgical and prenatal appointments with your health care provider. SEEK MEDICAL CARE IF:   You have abnormal vaginal discharge.  You have a rash.  You become lightheaded or feel faint.  You have abdominal pain that is not controlled with pain medicine. SEEK IMMEDIATE MEDICAL CARE IF:   You develop vaginal bleeding.  You are leaking fluid or have a gush of fluid from the vagina.  You have a fever.  You faint.  You have uterine contractions.  You feel your baby is not moving as much as usual, or you cannot feel your baby move.  You have chest pain or shortness of breath. Document Released: 04/26/2013 Document Revised: 07/11/2013 Document Reviewed: 04/26/2013 [facility] Patient Information 2015 [employer], [place]. This information is not intended to replace advice given to you by your health care provider. Make sure you discuss any questions you have with your health care provider.  Post Anesthesia Home Care Instructions  Activity: Get plenty of rest for the remainder of  the day. A responsible adult should stay with you for 24 hours following the procedure.  For the next 24 hours, DO NOT: -Drive a car -Advertising copywriterperate machinery -Drink alcoholic beverages -Take any medication unless instructed by your physician -Make any legal decisions or sign important papers.  Meals: Start with liquid foods such as gelatin or soup. Progress to regular foods as tolerated. Avoid greasy, spicy, heavy foods. If nausea and/or vomiting occur, drink only clear liquids until the nausea and/or vomiting subsides. Call your physician if vomiting continues.  Special Instructions/Symptoms: Your throat may feel dry or sore from the anesthesia or the breathing tube placed in your throat during surgery. If this causes discomfort, gargle with warm salt water. The discomfort should disappear within 24 hours.

## 2014-02-15 NOTE — Anesthesia Procedure Notes (Signed)
Spinal  Patient location during procedure: OR Start time: 02/15/2014 9:37 AM Staffing Performed by: other anesthesia staff  Preanesthetic Checklist Completed: patient identified, site marked, surgical consent, pre-op evaluation, timeout performed, IV checked, risks and benefits discussed and monitors and equipment checked Spinal Block Patient position: sitting Prep: site prepped and draped and DuraPrep Patient monitoring: heart rate, cardiac monitor, continuous pulse ox and blood pressure Approach: midline Location: L3-4 Injection technique: single-shot Needle Needle type: Pencan  Needle gauge: 24 G Needle length: 9 cm Assessment Sensory level: T10 Events: paresthesia Additional Notes Spinal placed by SRNA.  Clear free flow CSF on first attempt.  Transient left paresthesia.  Patient allowed to sit for 60 seconds to allow for saddle block.  Patient tolerated procedure well with no apparent complications.  Jasmine DecemberA. Cassidy, MD

## 2014-02-15 NOTE — Transfer of Care (Signed)
Immediate Anesthesia Transfer of Care Note  Patient: Rhonda Gordon  Procedure(s) Performed: Procedure(s): CERCLAGE CERVICAL (N/A)  Patient Location: PACU  Anesthesia Type:Spinal  Level of Consciousness: awake, alert  and oriented  Airway & Oxygen Therapy: Patient Spontanous Breathing  Post-op Assessment: Report given to PACU RN and Post -op Vital signs reviewed and stable  Post vital signs: Reviewed and stable  Complications: No apparent anesthesia complications

## 2014-02-16 ENCOUNTER — Encounter (HOSPITAL_COMMUNITY): Payer: Self-pay | Admitting: Obstetrics and Gynecology

## 2014-02-16 NOTE — Anesthesia Postprocedure Evaluation (Signed)
  Anesthesia Post-op Note  Anesthesia Post Note  Patient: Rhonda Gordon  Procedure(s) Performed: Procedure(s) (LRB): CERCLAGE CERVICAL (N/A)  Anesthesia type: Spinal  Patient location: PACU  Post pain: Pain level controlled  Post assessment: Post-op Vital signs reviewed  Last Vitals:  Filed Vitals:   02/15/14 1230  BP: 118/72  Pulse: 89  Temp: 37.6 C  Resp: 16    Post vital signs: Reviewed  Level of consciousness: awake  Complications: No apparent anesthesia complications

## 2014-03-01 ENCOUNTER — Ambulatory Visit (INDEPENDENT_AMBULATORY_CARE_PROVIDER_SITE_OTHER): Payer: No Typology Code available for payment source | Admitting: Family Medicine

## 2014-03-01 VITALS — BP 127/74 | HR 96 | Temp 97.8°F | Wt 176.3 lb

## 2014-03-01 DIAGNOSIS — O0990 Supervision of high risk pregnancy, unspecified, unspecified trimester: Secondary | ICD-10-CM

## 2014-03-01 LAB — POCT URINALYSIS DIP (DEVICE)
Bilirubin Urine: NEGATIVE
Glucose, UA: NEGATIVE mg/dL
Hgb urine dipstick: NEGATIVE
KETONES UR: NEGATIVE mg/dL
Nitrite: NEGATIVE
PROTEIN: NEGATIVE mg/dL
SPECIFIC GRAVITY, URINE: 1.02 (ref 1.005–1.030)
Urobilinogen, UA: 1 mg/dL (ref 0.0–1.0)
pH: 7 (ref 5.0–8.0)

## 2014-03-01 NOTE — Progress Notes (Signed)
Anatomy u/s scheduled for 03/23/14 @ 10:30

## 2014-03-01 NOTE — Progress Notes (Signed)
Patient is 23 y.o. G2P0100 7561w0d.  +FM, denies LOF, VB, contractions, vaginal discharge.  Overall feeling well.  Monitoring what she eats.  Feels some pressure on left lower side (none currently), starting 2-3 days ago, denies pain, intermittent.   ==> advised if symptoms change or worsen will perform cervical length, as pt is asymptomatic at this time will not order

## 2014-03-23 ENCOUNTER — Ambulatory Visit (HOSPITAL_COMMUNITY)
Admission: RE | Admit: 2014-03-23 | Discharge: 2014-03-23 | Disposition: A | Discharge status: Home / Self Care | Payer: No Typology Code available for payment source | Source: Ambulatory Visit | Attending: Family Medicine | Admitting: Family Medicine

## 2014-03-23 ENCOUNTER — Other Ambulatory Visit: Payer: Self-pay | Admitting: Family Medicine

## 2014-03-23 DIAGNOSIS — Z3689 Encounter for other specified antenatal screening: Secondary | ICD-10-CM | POA: Diagnosis not present

## 2014-03-23 DIAGNOSIS — O09899 Supervision of other high risk pregnancies, unspecified trimester: Secondary | ICD-10-CM | POA: Diagnosis not present

## 2014-03-23 DIAGNOSIS — O0990 Supervision of high risk pregnancy, unspecified, unspecified trimester: Secondary | ICD-10-CM

## 2014-03-23 DIAGNOSIS — O3430 Maternal care for cervical incompetence, unspecified trimester: Secondary | ICD-10-CM | POA: Insufficient documentation

## 2014-03-23 DIAGNOSIS — O3432 Maternal care for cervical incompetence, second trimester: Secondary | ICD-10-CM

## 2014-03-29 ENCOUNTER — Ambulatory Visit (INDEPENDENT_AMBULATORY_CARE_PROVIDER_SITE_OTHER): Payer: No Typology Code available for payment source | Admitting: Obstetrics & Gynecology

## 2014-03-29 VITALS — BP 111/67 | HR 105 | Temp 98.7°F | Wt 175.5 lb

## 2014-03-29 DIAGNOSIS — IMO0002 Reserved for concepts with insufficient information to code with codable children: Secondary | ICD-10-CM

## 2014-03-29 DIAGNOSIS — Z23 Encounter for immunization: Secondary | ICD-10-CM

## 2014-03-29 DIAGNOSIS — Z0489 Encounter for examination and observation for other specified reasons: Secondary | ICD-10-CM

## 2014-03-29 DIAGNOSIS — O3432 Maternal care for cervical incompetence, second trimester: Secondary | ICD-10-CM

## 2014-03-29 DIAGNOSIS — O3430 Maternal care for cervical incompetence, unspecified trimester: Secondary | ICD-10-CM

## 2014-03-29 DIAGNOSIS — Z1389 Encounter for screening for other disorder: Secondary | ICD-10-CM

## 2014-03-29 LAB — POCT URINALYSIS DIP (DEVICE)
Bilirubin Urine: NEGATIVE
Glucose, UA: NEGATIVE mg/dL
HGB URINE DIPSTICK: NEGATIVE
Ketones, ur: NEGATIVE mg/dL
Nitrite: NEGATIVE
PROTEIN: NEGATIVE mg/dL
SPECIFIC GRAVITY, URINE: 1.015 (ref 1.005–1.030)
UROBILINOGEN UA: 0.2 mg/dL (ref 0.0–1.0)
pH: 7.5 (ref 5.0–8.0)

## 2014-03-29 NOTE — Progress Notes (Signed)
Transvaginal U/S 04/06/14 @ 215p with MFM.  Followup U/S 04/19/14 @ 830a with MFM.

## 2014-03-29 NOTE — Progress Notes (Signed)
Inadequate anatomy scan at 18 weeks, repeat scan ordered in 4 weeks.  Cervix was 3.4 cm, no funneling, cerclage in place. Patient reassured.  Will get cervical length scan every two weeks until 28 weeks at [facility] Received Flu vaccine in 08/2013; declines today. No other complaints or concerns.  Routine obstetric precautions reviewed. For her R ear pain, patient was told to visit Urgent Care Center if this worsens.

## 2014-03-29 NOTE — Patient Instructions (Signed)
Return to clinic for any obstetric concerns or go to MAU for evaluation  

## 2014-03-29 NOTE — Progress Notes (Signed)
Reports pain in feet in ankles and pelvic pressure after being at work/on feet too long. Reports headaches and dizzy as well after being on feet too long.  Reports right ear pain.

## 2014-04-06 ENCOUNTER — Ambulatory Visit (HOSPITAL_COMMUNITY)
Admission: RE | Admit: 2014-04-06 | Discharge: 2014-04-06 | Disposition: A | Discharge status: Home / Self Care | Payer: No Typology Code available for payment source | Source: Ambulatory Visit | Attending: Obstetrics & Gynecology | Admitting: Obstetrics & Gynecology

## 2014-04-06 VITALS — BP 129/71 | HR 101 | Wt 179.0 lb

## 2014-04-06 DIAGNOSIS — Z3689 Encounter for other specified antenatal screening: Secondary | ICD-10-CM | POA: Insufficient documentation

## 2014-04-06 DIAGNOSIS — O3430 Maternal care for cervical incompetence, unspecified trimester: Secondary | ICD-10-CM | POA: Insufficient documentation

## 2014-04-06 DIAGNOSIS — O3432 Maternal care for cervical incompetence, second trimester: Secondary | ICD-10-CM

## 2014-04-08 ENCOUNTER — Encounter (HOSPITAL_COMMUNITY): Payer: Self-pay

## 2014-04-08 ENCOUNTER — Inpatient Hospital Stay (HOSPITAL_COMMUNITY)
Admission: AD | Admit: 2014-04-08 | Discharge: 2014-04-09 | Disposition: A | Discharge status: Home / Self Care | Payer: No Typology Code available for payment source | Source: Ambulatory Visit | Attending: Obstetrics and Gynecology | Admitting: Obstetrics and Gynecology

## 2014-04-08 DIAGNOSIS — O2690 Pregnancy related conditions, unspecified, unspecified trimester: Secondary | ICD-10-CM

## 2014-04-08 DIAGNOSIS — R109 Unspecified abdominal pain: Secondary | ICD-10-CM | POA: Diagnosis present

## 2014-04-08 DIAGNOSIS — O9989 Other specified diseases and conditions complicating pregnancy, childbirth and the puerperium: Principal | ICD-10-CM

## 2014-04-08 DIAGNOSIS — O99891 Other specified diseases and conditions complicating pregnancy: Secondary | ICD-10-CM | POA: Insufficient documentation

## 2014-04-08 DIAGNOSIS — O3430 Maternal care for cervical incompetence, unspecified trimester: Secondary | ICD-10-CM | POA: Insufficient documentation

## 2014-04-08 NOTE — MAU Provider Note (Signed)
  History   G2P0100 at 20 wk in with constant low abd pain. Has circlage  In place. Denies ROM or vag bleeding.  CSN: [id number]  Arrival date and time: 04/08/14 2318   First Provider Initiated Contact with Patient 04/08/14 2357      Chief Complaint  Patient presents with  . Abdominal Pain   HPI  OB History   Grav Para Term Preterm Abortions TAB SAB Ect Mult Living   0      Past Medical History  Diagnosis Date  . SVD (spontaneous vaginal delivery) 08/2013    svd - fetal loss at 27 wks  . Preterm labor     Past Surgical History  Procedure Laterality Date  . Tooth extraction    . Cervical cerclage N/A 02/15/2014    Procedure: CERCLAGE CERVICAL;  Surgeon: Catalina Antigua, MD;  Location: [facility];  Service: Gynecology;  Laterality: N/A;    Family History  Problem Relation Age of Onset  . Hypertension Mother   . Hypertension Sister     History  Substance Use Topics  . Smoking status: Never Smoker   . Smokeless tobacco: Never Used  . Alcohol Use: No    Allergies: No Known Allergies  Prescriptions prior to admission  Medication Sig Dispense Refill  . docusate sodium (COLACE) 100 MG capsule Take 1 capsule (100 mg total) by mouth 2 (two) times daily as needed.  30 capsule  2  . oxyCODONE-acetaminophen (PERCOCET/ROXICET) 5-325 MG per tablet Take 1 tablet by mouth every 4 (four) hours as needed.  20 tablet  0  . Prenatal Vit-Fe Fumarate-FA (PRENATAL VITAMINS) 28-0.8 MG TABS Take 1 tablet by mouth daily.         Review of Systems  Constitutional: Negative.   HENT: Negative.   Eyes: Negative.   Respiratory: Negative.   Cardiovascular: Negative.   Gastrointestinal: Positive for abdominal pain.  Genitourinary: Negative.   Musculoskeletal: Negative.   Skin: Negative.   Neurological: Negative.   Endo/Heme/Allergies: Negative.   Psychiatric/Behavioral: Negative.    Physical Exam   Blood pressure 111/60, pulse 81, temperature 98.4 F (36.9 C), temperature  source Oral, resp. rate 18, height 5' 4.5" (1.638 m), weight 179 lb 6.4 oz (81.375 kg), last menstrual period 11/16/2013, SpO2 100.00%.  Physical Exam  Constitutional: She is oriented to person, place, and time. She appears well-developed and well-nourished.  HENT:  Head: Normocephalic.  Eyes: Pupils are equal, round, and reactive to light.  Neck: Normal range of motion.  Cardiovascular: Normal rate, regular rhythm, normal heart sounds and intact distal pulses.   Respiratory: Effort normal and breath sounds normal.  GI: Soft. Bowel sounds are normal.  Genitourinary: Vagina normal and uterus normal.  Musculoskeletal: Normal range of motion.  Neurological: She is alert and oriented to person, place, and time. She has normal reflexes.  Skin: Skin is warm and dry.  Psychiatric: She has a normal mood and affect. Her behavior is normal. Judgment and thought content normal.    MAU Course  Procedures  MDM D/C home  Assessment and Plan  SVE cl/post/high. Stitch intact no tension on stitch.  Sherrine Salberg DARLENE 04/08/2014, 11:59 PM

## 2014-04-08 NOTE — MAU Note (Signed)
Having lower abdominal pain since 5pm. Hurts to walk-aching. Drank water, laid down to see if it would help and it did not. Denies vag bleeding or LOF. Denies urinary symptoms.

## 2014-04-09 ENCOUNTER — Encounter: Payer: Self-pay | Admitting: Obstetrics & Gynecology

## 2014-04-09 DIAGNOSIS — O99891 Other specified diseases and conditions complicating pregnancy: Secondary | ICD-10-CM | POA: Diagnosis not present

## 2014-04-09 LAB — URINALYSIS, ROUTINE W REFLEX MICROSCOPIC
Bilirubin Urine: NEGATIVE
GLUCOSE, UA: NEGATIVE mg/dL
HGB URINE DIPSTICK: NEGATIVE
KETONES UR: NEGATIVE mg/dL
Nitrite: NEGATIVE
PROTEIN: NEGATIVE mg/dL
Specific Gravity, Urine: 1.015 (ref 1.005–1.030)
Urobilinogen, UA: 1 mg/dL (ref 0.0–1.0)
pH: 7 (ref 5.0–8.0)

## 2014-04-09 LAB — URINE MICROSCOPIC-ADD ON

## 2014-04-09 NOTE — Discharge Instructions (Signed)
Abdominal Pain During Pregnancy °Belly (abdominal) pain is common during pregnancy. Most of the time, it is not a serious problem. Other times, it can be a sign that something is wrong with the pregnancy. Always tell your doctor if you have belly pain. °HOME CARE °Monitor your belly pain for any changes. The following actions may help you feel better: °· Do not have sex (intercourse) or put anything in your vagina until you feel better. °· Rest until your pain stops. °· Drink clear fluids if you feel sick to your stomach (nauseous). Do not eat solid food until you feel better. °· Only take medicine as told by your doctor. °· Keep all doctor visits as told. °GET HELP RIGHT AWAY IF:  °· You are bleeding, leaking fluid, or pieces of tissue come out of your vagina. °· You have more pain or cramping. °· You keep throwing up (vomiting). °· You have pain when you pee (urinate) or have blood in your pee. °· You have a fever. °· You do not feel your baby moving as much. °· You feel very weak or feel like passing out. °· You have trouble breathing, with or without belly pain. °· You have a very bad headache and belly pain. °· You have fluid leaking from your vagina and belly pain. °· You keep having watery poop (diarrhea). °· Your belly pain does not go away after resting, or the pain gets worse. °MAKE SURE YOU:  °· Understand these instructions. °· Will watch your condition. °· Will get help right away if you are not doing well or get worse. °Document Released: 06/24/2009 Document Revised: 03/08/2013 Document Reviewed: 02/02/2013 °ExitCare® Patient Information ©2015 Exit[employer], LLC. This information is not intended to replace advice given to you by your health care provider. Make sure you discuss any questions you have with your health care provider. ° °

## 2014-04-09 NOTE — MAU Provider Note (Signed)
Attestation of Attending Supervision of Advanced Practitioner (CNM/NP): Evaluation and management procedures were performed by the Advanced Practitioner under my supervision and collaboration.  I have reviewed the Advanced Practitioner's note and chart, and I agree with the management and plan.  Johntae Broxterman 04/09/2014 8:08 AM   

## 2014-04-19 ENCOUNTER — Encounter (HOSPITAL_COMMUNITY): Payer: Self-pay

## 2014-04-19 ENCOUNTER — Ambulatory Visit (HOSPITAL_COMMUNITY)
Admission: RE | Admit: 2014-04-19 | Discharge: 2014-04-19 | Disposition: A | Discharge status: Home / Self Care | Payer: No Typology Code available for payment source | Source: Ambulatory Visit | Attending: Obstetrics & Gynecology | Admitting: Obstetrics & Gynecology

## 2014-04-19 ENCOUNTER — Ambulatory Visit: Payer: No Typology Code available for payment source

## 2014-04-19 VITALS — BP 121/72 | HR 97 | Wt 182.2 lb

## 2014-04-19 DIAGNOSIS — O3432 Maternal care for cervical incompetence, second trimester: Secondary | ICD-10-CM | POA: Insufficient documentation

## 2014-04-19 DIAGNOSIS — Z3A22 22 weeks gestation of pregnancy: Secondary | ICD-10-CM | POA: Insufficient documentation

## 2014-04-19 DIAGNOSIS — Z36 Encounter for antenatal screening of mother: Secondary | ICD-10-CM | POA: Diagnosis not present

## 2014-04-19 DIAGNOSIS — IMO0002 Reserved for concepts with insufficient information to code with codable children: Secondary | ICD-10-CM

## 2014-04-19 DIAGNOSIS — Z0489 Encounter for examination and observation for other specified reasons: Secondary | ICD-10-CM

## 2014-04-20 ENCOUNTER — Other Ambulatory Visit: Payer: Self-pay | Admitting: Obstetrics & Gynecology

## 2014-04-20 DIAGNOSIS — Z3A23 23 weeks gestation of pregnancy: Secondary | ICD-10-CM

## 2014-04-20 DIAGNOSIS — O3432 Maternal care for cervical incompetence, second trimester: Secondary | ICD-10-CM

## 2014-04-23 ENCOUNTER — Encounter: Payer: Self-pay | Admitting: *Deleted

## 2014-04-26 ENCOUNTER — Other Ambulatory Visit (HOSPITAL_COMMUNITY): Payer: Self-pay | Admitting: Obstetrics and Gynecology

## 2014-04-26 ENCOUNTER — Other Ambulatory Visit: Payer: Self-pay | Admitting: Obstetrics & Gynecology

## 2014-04-26 ENCOUNTER — Encounter: Payer: Self-pay | Admitting: *Deleted

## 2014-04-26 ENCOUNTER — Ambulatory Visit (HOSPITAL_COMMUNITY)
Admission: RE | Admit: 2014-04-26 | Discharge: 2014-04-26 | Disposition: A | Discharge status: Home / Self Care | Payer: Medicaid Other | Source: Ambulatory Visit | Attending: Obstetrics & Gynecology | Admitting: Obstetrics & Gynecology

## 2014-04-26 ENCOUNTER — Ambulatory Visit (INDEPENDENT_AMBULATORY_CARE_PROVIDER_SITE_OTHER): Payer: No Typology Code available for payment source | Admitting: Obstetrics & Gynecology

## 2014-04-26 VITALS — BP 100/83 | HR 102 | Temp 98.8°F | Wt 180.1 lb

## 2014-04-26 DIAGNOSIS — O3432 Maternal care for cervical incompetence, second trimester: Secondary | ICD-10-CM | POA: Diagnosis present

## 2014-04-26 DIAGNOSIS — O0992 Supervision of high risk pregnancy, unspecified, second trimester: Secondary | ICD-10-CM

## 2014-04-26 DIAGNOSIS — Z3A23 23 weeks gestation of pregnancy: Secondary | ICD-10-CM

## 2014-04-26 LAB — POCT URINALYSIS DIP (DEVICE)
Bilirubin Urine: NEGATIVE
Glucose, UA: NEGATIVE mg/dL
Hgb urine dipstick: NEGATIVE
KETONES UR: NEGATIVE mg/dL
Leukocytes, UA: NEGATIVE
Nitrite: NEGATIVE
Protein, ur: NEGATIVE mg/dL
SPECIFIC GRAVITY, URINE: 1.015 (ref 1.005–1.030)
UROBILINOGEN UA: 1 mg/dL (ref 0.0–1.0)
pH: 7 (ref 5.0–8.0)

## 2014-04-26 NOTE — Progress Notes (Signed)
Patients Rhonda Gordon  Application was faxed last week but without her insurance info. I refaxed it today with her insurance card. We will call patient when med arrives.

## 2014-04-26 NOTE — Patient Instructions (Signed)
Return to clinic for any obstetric concerns or go to MAU for evaluation  

## 2014-04-26 NOTE — Progress Notes (Signed)
Ultrasound showed normal anatomy, cervical length 2.2 cm.  Continue cervical length scans. Scheduled to start 17P today.  No other complaints or concerns.  Routine obstetric precautions reviewed.  Addendum: patient's Cruzita LedererMakena is not available today. The company will be contacted.

## 2014-05-02 ENCOUNTER — Encounter: Payer: Self-pay | Admitting: *Deleted

## 2014-05-10 ENCOUNTER — Encounter (HOSPITAL_COMMUNITY): Payer: Self-pay

## 2014-05-10 ENCOUNTER — Ambulatory Visit (HOSPITAL_COMMUNITY)
Admission: RE | Admit: 2014-05-10 | Discharge: 2014-05-10 | Disposition: A | Discharge status: Home / Self Care | Payer: Medicaid Other | Source: Ambulatory Visit | Attending: Obstetrics and Gynecology | Admitting: Obstetrics and Gynecology

## 2014-05-10 VITALS — BP 125/76 | HR 108 | Wt 184.8 lb

## 2014-05-10 DIAGNOSIS — Z3A25 25 weeks gestation of pregnancy: Secondary | ICD-10-CM | POA: Diagnosis not present

## 2014-05-10 DIAGNOSIS — O3432 Maternal care for cervical incompetence, second trimester: Secondary | ICD-10-CM | POA: Insufficient documentation

## 2014-05-10 DIAGNOSIS — Z36 Encounter for antenatal screening of mother: Secondary | ICD-10-CM | POA: Diagnosis not present

## 2014-05-11 ENCOUNTER — Other Ambulatory Visit (HOSPITAL_COMMUNITY): Payer: Self-pay | Admitting: Maternal and Fetal Medicine

## 2014-05-11 DIAGNOSIS — O3432 Maternal care for cervical incompetence, second trimester: Secondary | ICD-10-CM

## 2014-05-21 ENCOUNTER — Encounter (HOSPITAL_COMMUNITY): Payer: Self-pay

## 2014-05-23 ENCOUNTER — Encounter: Payer: Self-pay | Admitting: *Deleted

## 2014-05-24 ENCOUNTER — Encounter: Payer: No Typology Code available for payment source | Admitting: Family

## 2014-05-24 ENCOUNTER — Other Ambulatory Visit (HOSPITAL_COMMUNITY): Payer: Self-pay | Admitting: Maternal and Fetal Medicine

## 2014-05-24 ENCOUNTER — Telehealth: Payer: Self-pay | Admitting: *Deleted

## 2014-05-24 ENCOUNTER — Ambulatory Visit (HOSPITAL_COMMUNITY)
Admission: RE | Admit: 2014-05-24 | Discharge: 2014-05-24 | Disposition: A | Discharge status: Home / Self Care | Payer: Medicaid Other | Source: Ambulatory Visit | Attending: Family | Admitting: Family

## 2014-05-24 DIAGNOSIS — O3430 Maternal care for cervical incompetence, unspecified trimester: Secondary | ICD-10-CM | POA: Insufficient documentation

## 2014-05-24 DIAGNOSIS — Z3A27 27 weeks gestation of pregnancy: Secondary | ICD-10-CM | POA: Diagnosis not present

## 2014-05-24 DIAGNOSIS — O3432 Maternal care for cervical incompetence, second trimester: Secondary | ICD-10-CM | POA: Diagnosis not present

## 2014-05-24 DIAGNOSIS — O09212 Supervision of pregnancy with history of pre-term labor, second trimester: Secondary | ICD-10-CM | POA: Diagnosis not present

## 2014-05-24 NOTE — Telephone Encounter (Signed)
[facility] to check on status of patients makena. Representative stated that they have been communicating with patient. They initially filed an insurance that has recently been terminated so couldn't be used. Refiled with medicaid and has been approved. Rx sent to [facility] today and they should contact us in 24 hours to confirm receipt of prescription and schedule delivery of makena.

## 2014-05-25 ENCOUNTER — Telehealth: Payer: Self-pay | Admitting: *Deleted

## 2014-05-25 NOTE — Telephone Encounter (Signed)
[facility] called and said that they need patients medicaid case worker to send in updated November info. They can call Misty StanleyLisa at pharmacy at [phone]. Called patient to let her know to contact her caseworker and give her lisa info. No answer, unable to leave message.

## 2014-05-28 NOTE — Telephone Encounter (Signed)
Called patient and gave her the number for her case worker to call the pharmacy and that this should be the last step and then we should get her makena. Patient verbalized understanding and states that [facility] for submitting her application this late in her pregnancy and she is unsure if she even wants to do the [facility] now. Apologized to patient for her bad interaction with them and that she doesn't need to do anything else but to just have her case worker call them. Patient verbalized understanding and had no other questions

## 2014-05-29 ENCOUNTER — Encounter (HOSPITAL_COMMUNITY): Payer: Self-pay | Admitting: *Deleted

## 2014-05-29 ENCOUNTER — Inpatient Hospital Stay (HOSPITAL_COMMUNITY)
Admission: AD | Admit: 2014-05-29 | Discharge: 2014-05-29 | Disposition: A | Discharge status: Home / Self Care | Payer: Medicaid Other | Source: Ambulatory Visit | Attending: Obstetrics & Gynecology | Admitting: Obstetrics & Gynecology

## 2014-05-29 DIAGNOSIS — N898 Other specified noninflammatory disorders of vagina: Secondary | ICD-10-CM | POA: Diagnosis present

## 2014-05-29 DIAGNOSIS — R109 Unspecified abdominal pain: Secondary | ICD-10-CM

## 2014-05-29 DIAGNOSIS — O26892 Other specified pregnancy related conditions, second trimester: Secondary | ICD-10-CM

## 2014-05-29 DIAGNOSIS — Z3A27 27 weeks gestation of pregnancy: Secondary | ICD-10-CM

## 2014-05-29 DIAGNOSIS — O9989 Other specified diseases and conditions complicating pregnancy, childbirth and the puerperium: Secondary | ICD-10-CM | POA: Diagnosis not present

## 2014-05-29 LAB — URINE MICROSCOPIC-ADD ON

## 2014-05-29 LAB — URINALYSIS, ROUTINE W REFLEX MICROSCOPIC
Bilirubin Urine: NEGATIVE
GLUCOSE, UA: NEGATIVE mg/dL
Ketones, ur: NEGATIVE mg/dL
Nitrite: NEGATIVE
Protein, ur: NEGATIVE mg/dL
SPECIFIC GRAVITY, URINE: 1.015 (ref 1.005–1.030)
Urobilinogen, UA: 0.2 mg/dL (ref 0.0–1.0)
pH: 6.5 (ref 5.0–8.0)

## 2014-05-29 NOTE — Discharge Instructions (Signed)
Preterm Labor Information Preterm labor is when labor starts before you are [redacted] weeks pregnant. The normal length of pregnancy is 39 to 41 weeks.  CAUSES  The cause of preterm labor is not often known. The most common known cause is infection. RISK FACTORS  Having a history of preterm labor.  Having your water break before it should.  Having a placenta that covers the opening of the cervix.  Having a placenta that breaks away from the uterus.  Having a cervix that is too weak to hold the baby in the uterus.  Having too much fluid in the amniotic sac.  Taking drugs or smoking while pregnant.  Not gaining enough weight while pregnant.  Being younger than 18 and older than 22 years old.  Having a low income.  Being African American. SYMPTOMS  Period-like cramps, belly (abdominal) pain, or back pain.  Contractions that are regular, as often as six in an hour. They may be mild or painful.  Contractions that start at the top of the belly. They then move to the lower belly and back.  Lower belly pressure that seems to get stronger.  Bleeding from the vagina.  Fluid leaking from the vagina. TREATMENT  Treatment depends on:  Your condition.  The condition of your baby.  How many weeks pregnant you are. Your doctor may have you:  Take medicine to stop contractions.  Stay in bed except to use the restroom (bed rest).  Stay in the hospital. WHAT SHOULD YOU DO IF YOU THINK YOU ARE IN PRETERM LABOR? Call your doctor right away. You need to go to the hospital right away.  HOW CAN YOU PREVENT PRETERM LABOR IN FUTURE PREGNANCIES?  Stop smoking, if you smoke.  Maintain healthy weight gain.  Do not take drugs or be around chemicals that are not needed.  Tell your doctor if you think you have an infection.  Tell your doctor if you had a preterm labor before. Document Released: 10/02/2008 Document Revised: 04/26/2013 Document Reviewed: 10/02/2008 ExitCare Patient  Information 2015 [employer]. This information is not intended to replace advice given to you by your health care provider. Make sure you discuss any questions you have with your health care provider.  

## 2014-05-29 NOTE — MAU Note (Addendum)
States she felt some cramping on the way to the hospital, but none now. States she has a cerclage.

## 2014-05-29 NOTE — MAU Provider Note (Signed)
Chief Complaint:  Vaginal Discharge and Abdominal Cramping   Rhonda Gordon is a 23 y.o.  G2P0100 with IUP at 8448w5d with cerclage presenting for Vaginal Discharge and Abdominal Cramping .  Patient states she denies contractions, none vaginal bleeding, with active fetal movement.    Abdominal cramping: slight on way to MAU, then resolved, lasting 10 minutes continuously.  Gush of fluid: right after urination 0930 this morning. Could feel through leggings.  Occurred again when was on elevator.   Menstrual History: OB History    Gravida Para Term Preterm AB TAB SAB Ectopic Multiple Living   2 1  1       0      Patient's last menstrual period was 11/16/2013.      Past Medical History  Diagnosis Date  . SVD (spontaneous vaginal delivery) 08/2013    svd - fetal loss at 27 wks  . Preterm labor     Past Surgical History  Procedure Laterality Date  . Tooth extraction    . Cervical cerclage N/A 02/15/2014    Procedure: CERCLAGE CERVICAL;  Surgeon: Catalina AntiguaPeggy Constant, MD;  Location: [facility];  Service: Gynecology;  Laterality: N/A;    Family History  Problem Relation Age of Onset  . Hypertension Mother   . Hypertension Sister     History  Substance Use Topics  . Smoking status: Never Smoker   . Smokeless tobacco: Never Used  . Alcohol Use: No     No Known Allergies  Prescriptions prior to admission  Medication Sig Dispense Refill Last Dose  . docusate sodium (COLACE) 100 MG capsule Take 1 capsule (100 mg total) by mouth 2 (two) times daily as needed. 30 capsule 2 Taking  . oxyCODONE-acetaminophen (PERCOCET/ROXICET) 5-325 MG per tablet Take 1 tablet by mouth every 4 (four) hours as needed. 20 tablet 0 Not Taking  . Prenatal Vit-Fe Fumarate-FA (PRENATAL VITAMINS) 28-0.8 MG TABS Take 1 tablet by mouth daily.    Taking    Review of Systems - Negative except for what is mentioned in HPI.  Physical Exam  Blood pressure 129/81, pulse 108, temperature 98.7 F (37.1 C), temperature  source Oral, resp. rate 18, height 5\' 7"  (1.702 m), weight 191 lb 6.4 oz (86.818 kg), last menstrual period 11/16/2013. GENERAL: Well-developed, well-nourished female in no acute distress.  LUNGS: no distress HEART: Regular rate ABDOMEN: Soft, nontender, nondistended, gravid.  EXTREMITIES: Nontender, no edema, 2+ distal pulses. Cervical Exam: visually closed, cerclage noted in place, no pooling, negative valsalva, normal minimal vaginal discharge noted  NST: reactive    Labs: No results found for this or any previous visit (from the past 24 hour(s)).  Imaging Studies:  [place] Ob Follow Up  05/24/2014   OBSTETRICAL ULTRASOUND: This exam was performed within a [facility]. The OB US report was generated in the AS system, and faxed to the ordering physician.   This report is available in the [facility] [employer] PACS. See the AS Obstetric US report via the Image Link.  [place] Mfm Ob Transvaginal  05/24/2014   OBSTETRICAL ULTRASOUND: This exam was performed within a [facility]. The OB US report was generated in the AS system, and faxed to the ordering physician.   This report is available in the [facility] [employer] PACS. See the AS Obstetric US report via the Image Link.  [place] Mfm Ob Transvaginal  05/24/2014   OBSTETRICAL ULTRASOUND: This exam was performed within a [facility]. The OB US report was generated in the  AS system, and faxed to the ordering physician.   This report is available in the [facility] [employer] PACS. See the AS Obstetric US report via the Image Link.   Assessment: Rhonda Gordon is  23 y.o. G2P0100 at 3864w5d presents with Vaginal Discharge and Abdominal Cramping .  Plan: Sterile speculum exam: normal, signs of rupture of membranes (considering patient did not soak leggings, just felt something, negative valsalva, no pooling, no persistent drainage).  Discharge. - encourage PO fluid intake - preterm labor precautions  Ian Cavey  ROCIO 11/10/201510:47 AM

## 2014-05-29 NOTE — MAU Note (Signed)
Patient states she has had leaking of clear fluid x 2 this am with some mild cramping. Not wearing a pad. Denies bleeding and reports feeling fetal movement.

## 2014-06-05 ENCOUNTER — Ambulatory Visit (INDEPENDENT_AMBULATORY_CARE_PROVIDER_SITE_OTHER): Payer: Medicaid Other | Admitting: Family Medicine

## 2014-06-05 VITALS — BP 107/77 | HR 114 | Temp 98.2°F | Wt 188.5 lb

## 2014-06-05 DIAGNOSIS — O3433 Maternal care for cervical incompetence, third trimester: Secondary | ICD-10-CM

## 2014-06-05 DIAGNOSIS — O0993 Supervision of high risk pregnancy, unspecified, third trimester: Secondary | ICD-10-CM

## 2014-06-05 LAB — POCT URINALYSIS DIP (DEVICE)
Bilirubin Urine: NEGATIVE
GLUCOSE, UA: NEGATIVE mg/dL
Hgb urine dipstick: NEGATIVE
Ketones, ur: NEGATIVE mg/dL
NITRITE: NEGATIVE
PROTEIN: NEGATIVE mg/dL
Specific Gravity, Urine: 1.02 (ref 1.005–1.030)
Urobilinogen, UA: 2 mg/dL — ABNORMAL HIGH (ref 0.0–1.0)
pH: 7 (ref 5.0–8.0)

## 2014-06-05 LAB — CBC
HCT: 31.6 % — ABNORMAL LOW (ref 36.0–46.0)
HEMOGLOBIN: 11 g/dL — AB (ref 12.0–15.0)
MCH: 30.4 pg (ref 26.0–34.0)
MCHC: 34.8 g/dL (ref 30.0–36.0)
MCV: 87.3 fL (ref 78.0–100.0)
MPV: 9.5 fL (ref 9.4–12.4)
PLATELETS: 244 10*3/uL (ref 150–400)
RBC: 3.62 MIL/uL — AB (ref 3.87–5.11)
RDW: 13.7 % (ref 11.5–15.5)
WBC: 6.5 10*3/uL (ref 4.0–10.5)

## 2014-06-05 MED ORDER — TETANUS-DIPHTH-ACELL PERTUSSIS 5-2.5-18.5 LF-MCG/0.5 IM SUSP
0.5000 mL | Freq: Once | INTRAMUSCULAR | Status: AC
Start: 2014-06-05 — End: 2014-06-05
  Administered 2014-06-05: 0.5 mL via INTRAMUSCULAR

## 2014-06-05 MED ORDER — RHO D IMMUNE GLOBULIN 1500 UNIT/2ML IJ SOSY
300.0000 ug | PREFILLED_SYRINGE | Freq: Once | INTRAMUSCULAR | Status: AC
  Administered 2014-06-05: 300 ug via INTRAMUSCULAR

## 2014-06-05 NOTE — Progress Notes (Signed)
Patient without complaints.  No problems with cerclage.  Denies vaginal bleeding, abnormal vaginal discharge, contractions, loss of fluid.  Denies abdominal pain, headache, scotoma.  Reports good fetal activity. 28 week labs drawn today.  Continue 17-P.  Tdap given today.  Follow up in 2 weeks.

## 2014-06-05 NOTE — Progress Notes (Signed)
[phone] and spoke to LyonsLisa regarding patient's 17P and medicaid. Misty StanleyLisa states she will try to see what she can do and contact clinic once she has more information.

## 2014-06-05 NOTE — Progress Notes (Signed)
Patient verbalized understanding that we are releasing her back to work. No questions or concerns.

## 2014-06-05 NOTE — Progress Notes (Signed)
Called patient and informed her she needs to contact case worker regarding coverage for 17P. Patient verbalized understanding.

## 2014-06-05 NOTE — Progress Notes (Signed)
Addendum - Patient hasn't been getting 17-P.  Will have patient called to see if she wants to get 17-P and finish the paperwork if she does.

## 2014-06-05 NOTE — Progress Notes (Signed)
Patient here with Continuing disability paperwork to fill out-- states paperwork must state the date 6 weeks after delivery as Dr. Sherrie Georgeecker put her on bedrest. Per chart review, Dr. Sherrie Georgeecker wrote paper RX for bed rest until further notice. No other documentation recorded. Per Dr. Adrian BlackwaterStinson, advised to call Dr. Sherrie Georgeecker. Spoke to Dr. Sherrie Georgeecker regarding patient's status-- after reviewing chart Dr. Sherrie Georgeecker states patient should no longer be on bed rest and may return to work. Dr. Adrian BlackwaterStinson informed. Continuing disability paperwork filled out for patient's release without restriction. Patient to follow up with Dr. Sherrie Georgeecker

## 2014-06-05 NOTE — Patient Instructions (Signed)
Third Trimester of Pregnancy The third trimester is from week 29 through week 42, months 7 through 9. The third trimester is a time when the fetus is growing rapidly. At the end of the ninth month, the fetus is about 20 inches in length and weighs 6-10 pounds.  BODY CHANGES Your body goes through many changes during pregnancy. The changes vary from woman to woman.   Your weight will continue to increase. You can expect to gain 25-35 pounds (11-16 kg) by the end of the pregnancy.  You may begin to get stretch marks on your hips, abdomen, and breasts.  You may urinate more often because the fetus is moving lower into your pelvis and pressing on your bladder.  You may develop or continue to have heartburn as a result of your pregnancy.  You may develop constipation because certain hormones are causing the muscles that push waste through your intestines to slow down.  You may develop hemorrhoids or swollen, bulging veins (varicose veins).  You may have pelvic pain because of the weight gain and pregnancy hormones relaxing your joints between the bones in your pelvis. Backaches may result from overexertion of the muscles supporting your posture.  You may have changes in your hair. These can include thickening of your hair, rapid growth, and changes in texture. Some women also have hair loss during or after pregnancy, or hair that feels dry or thin. Your hair will most likely return to normal after your baby is born.  Your breasts will continue to grow and be tender. A yellow discharge may leak from your breasts called colostrum.  Your belly button may stick out.  You may feel short of breath because of your expanding uterus.  You may notice the fetus "dropping," or moving lower in your abdomen.  You may have a bloody mucus discharge. This usually occurs a few days to a week before labor begins.  Your cervix becomes thin and soft (effaced) near your due date. WHAT TO EXPECT AT YOUR PRENATAL  EXAMS  You will have prenatal exams every 2 weeks until week 36. Then, you will have weekly prenatal exams. During a routine prenatal visit:  You will be weighed to make sure you and the fetus are growing normally.  Your blood pressure is taken.  Your abdomen will be measured to track your baby's growth.  The fetal heartbeat will be listened to.  Any test results from the previous visit will be discussed.  You may have a cervical check near your due date to see if you have effaced. At around 36 weeks, your caregiver will check your cervix. At the same time, your caregiver will also perform a test on the secretions of the vaginal tissue. This test is to determine if a type of bacteria, Group B streptococcus, is present. Your caregiver will explain this further. Your caregiver may ask you:  What your birth plan is.  How you are feeling.  If you are feeling the baby move.  If you have had any abnormal symptoms, such as leaking fluid, bleeding, severe headaches, or abdominal cramping.  If you have any questions. Other tests or screenings that may be performed during your third trimester include:  Blood tests that check for low iron levels (anemia).  Fetal testing to check the health, activity level, and growth of the fetus. Testing is done if you have certain medical conditions or if there are problems during the pregnancy. FALSE LABOR You may feel small, irregular contractions that   eventually go away. These are called Braxton Hicks contractions, or false labor. Contractions may last for hours, days, or even weeks before true labor sets in. If contractions come at regular intervals, intensify, or become painful, it is best to be seen by your caregiver.  SIGNS OF LABOR   Menstrual-like cramps.  Contractions that are 5 minutes apart or less.  Contractions that start on the top of the uterus and spread down to the lower abdomen and back.  A sense of increased pelvic pressure or back  pain.  A watery or bloody mucus discharge that comes from the vagina. If you have any of these signs before the 37th week of pregnancy, call your caregiver right away. You need to go to the hospital to get checked immediately. HOME CARE INSTRUCTIONS   Avoid all smoking, herbs, alcohol, and unprescribed drugs. These chemicals affect the formation and growth of the baby.  Follow your caregiver's instructions regarding medicine use. There are medicines that are either safe or unsafe to take during pregnancy.  Exercise only as directed by your caregiver. Experiencing uterine cramps is a good sign to stop exercising.  Continue to eat regular, healthy meals.  Wear a good support bra for breast tenderness.  Do not use hot tubs, steam rooms, or saunas.  Wear your seat belt at all times when driving.  Avoid raw meat, uncooked cheese, cat litter boxes, and soil used by cats. These carry germs that can cause birth defects in the baby.  Take your prenatal vitamins.  Try taking a stool softener (if your caregiver approves) if you develop constipation. Eat more high-fiber foods, such as fresh vegetables or fruit and whole grains. Drink plenty of fluids to keep your urine clear or pale yellow.  Take warm sitz baths to soothe any pain or discomfort caused by hemorrhoids. Use hemorrhoid cream if your caregiver approves.  If you develop varicose veins, wear support hose. Elevate your feet for 15 minutes, 3-4 times a day. Limit salt in your diet.  Avoid heavy lifting, wear low heal shoes, and practice good posture.  Rest a lot with your legs elevated if you have leg cramps or low back pain.  Visit your dentist if you have not gone during your pregnancy. Use a soft toothbrush to brush your teeth and be gentle when you floss.  A sexual relationship may be continued unless your caregiver directs you otherwise.  Do not travel far distances unless it is absolutely necessary and only with the approval  of your caregiver.  Take prenatal classes to understand, practice, and ask questions about the labor and delivery.  Make a trial run to the hospital.  Pack your hospital bag.  Prepare the baby's nursery.  Continue to go to all your prenatal visits as directed by your caregiver. SEEK MEDICAL CARE IF:  You are unsure if you are in labor or if your water has broken.  You have dizziness.  You have mild pelvic cramps, pelvic pressure, or nagging pain in your abdominal area.  You have persistent nausea, vomiting, or diarrhea.  You have a bad smelling vaginal discharge.  You have pain with urination. SEEK IMMEDIATE MEDICAL CARE IF:   You have a fever.  You are leaking fluid from your vagina.  You have spotting or bleeding from your vagina.  You have severe abdominal cramping or pain.  You have rapid weight loss or gain.  You have shortness of breath with chest pain.  You notice sudden or extreme swelling   of your face, hands, ankles, feet, or legs.  You have not felt your baby move in over an hour.  You have severe headaches that do not go away with medicine.  You have vision changes. Document Released: 06/30/2001 Document Revised: 07/11/2013 Document Reviewed: 09/06/2012 ExitCare Patient Information 2015 Exit[employer]. This information is not intended to replace advice given to you by your health care provider. Make sure you discuss any questions you have with your health care provider.  

## 2014-06-05 NOTE — Progress Notes (Signed)
tdap and rhogam today.  28 week packet given.

## 2014-06-05 NOTE — Progress Notes (Signed)
Clinic has not received 17P. Discussed case with Dr. Adrian BlackwaterStinson-- states we should still start it if patient would like to. Patient states she would and adds she spoke to her medicaid case worker who stated the claim should be resubmitted to [facility] [facility] [employer] and it will be paid for. Informed patient that I would contact Makena.

## 2014-06-05 NOTE — Progress Notes (Signed)
[facility] called back and stated she ran claim through medicaid again and it is stating "service not covered, patient not covered." Advised that patient call medicaid case worker again to verify if there is a Data processing managerglitch or not. Informed her I would contact patient.

## 2014-06-05 NOTE — Progress Notes (Signed)
Disability paperwork given to patient to fax to her employer. Explained to patient that she is no longer on bedrest and may return to work without restriction.

## 2014-06-06 LAB — GLUCOSE TOLERANCE, 1 HOUR (50G) W/O FASTING: GLUCOSE 1 HOUR GTT: 127 mg/dL (ref 70–140)

## 2014-06-06 LAB — RPR

## 2014-06-06 LAB — HIV ANTIBODY (ROUTINE TESTING W REFLEX): HIV: NONREACTIVE

## 2014-06-19 ENCOUNTER — Ambulatory Visit (INDEPENDENT_AMBULATORY_CARE_PROVIDER_SITE_OTHER): Payer: Self-pay | Admitting: Family Medicine

## 2014-06-19 VITALS — BP 123/71 | HR 102 | Temp 98.2°F | Wt 188.0 lb

## 2014-06-19 DIAGNOSIS — O3433 Maternal care for cervical incompetence, third trimester: Secondary | ICD-10-CM

## 2014-06-19 DIAGNOSIS — O0993 Supervision of high risk pregnancy, unspecified, third trimester: Secondary | ICD-10-CM

## 2014-06-19 LAB — POCT URINALYSIS DIP (DEVICE)
BILIRUBIN URINE: NEGATIVE
GLUCOSE, UA: NEGATIVE mg/dL
Ketones, ur: NEGATIVE mg/dL
Nitrite: NEGATIVE
Protein, ur: 30 mg/dL — AB
Specific Gravity, Urine: 1.02 (ref 1.005–1.030)
Urobilinogen, UA: 1 mg/dL (ref 0.0–1.0)
pH: 6.5 (ref 5.0–8.0)

## 2014-06-19 NOTE — Progress Notes (Signed)
Does not desire 17-P.  No complaints.  Good fetal movement.  No contractions, bleeding, spotting.  Would like nexplanon for birth control.  Labor precautions given.

## 2014-06-19 NOTE — Patient Instructions (Signed)
Third Trimester of Pregnancy The third trimester is from week 29 through week 42, months 7 through 9. This trimester is when your unborn baby (fetus) is growing very fast. At the end of the ninth month, the unborn baby is about 20 inches in length. It weighs about 6-10 pounds.  HOME CARE   Avoid all smoking, herbs, and alcohol. Avoid drugs not approved by your doctor.  Only take medicine as told by your doctor. Some medicines are safe and some are not during pregnancy.  Exercise only as told by your doctor. Stop exercising if you start having cramps.  Eat regular, healthy meals.  Wear a good support bra if your breasts are tender.  Do not use hot tubs, steam rooms, or saunas.  Wear your seat belt when driving.  Avoid raw meat, uncooked cheese, and liter boxes and soil used by cats.  Take your prenatal vitamins.  Try taking medicine that helps you poop (stool softener) as needed, and if your doctor approves. Eat more fiber by eating fresh fruit, vegetables, and whole grains. Drink enough fluids to keep your pee (urine) clear or pale yellow.  Take warm water baths (sitz baths) to soothe pain or discomfort caused by hemorrhoids. Use hemorrhoid cream if your doctor approves.  If you have puffy, bulging veins (varicose veins), wear support hose. Raise (elevate) your feet for 15 minutes, 3-4 times a day. Limit salt in your diet.  Avoid heavy lifting, wear low heels, and sit up straight.  Rest with your legs raised if you have leg cramps or low back pain.  Visit your dentist if you have not gone during your pregnancy. Use a soft toothbrush to brush your teeth. Be gentle when you floss.  You can have sex (intercourse) unless your doctor tells you not to.  Do not travel far distances unless you must. Only do so with your doctor's approval.  Take prenatal classes.  Practice driving to the hospital.  Pack your hospital bag.  Prepare the baby's room.  Go to your doctor visits. GET  HELP IF:  You are not sure if you are in labor or if your water has broken.  You are dizzy.  You have mild cramps or pressure in your lower belly (abdominal).  You have a nagging pain in your belly area.  You continue to feel sick to your stomach (nauseous), throw up (vomit), or have watery poop (diarrhea).  You have bad smelling fluid coming from your vagina.  You have pain with peeing (urination). GET HELP RIGHT AWAY IF:   You have a fever.  You are leaking fluid from your vagina.  You are spotting or bleeding from your vagina.  You have severe belly cramping or pain.  You lose or gain weight rapidly.  You have trouble catching your breath and have chest pain.  You notice sudden or extreme puffiness (swelling) of your face, hands, ankles, feet, or legs.  You have not felt the baby move in over an hour.  You have severe headaches that do not go away with medicine.  You have vision changes. Document Released: 09/30/2009 Document Revised: 10/31/2012 Document Reviewed: 09/06/2012 ExitCare Patient Information 2015 Exit[employer]. This information is not intended to replace advice given to you by your health care provider. Make sure you discuss any questions you have with your health care provider.  

## 2014-06-21 ENCOUNTER — Other Ambulatory Visit: Payer: Self-pay | Admitting: Family Medicine

## 2014-06-21 ENCOUNTER — Encounter (HOSPITAL_COMMUNITY): Payer: Self-pay

## 2014-06-21 ENCOUNTER — Ambulatory Visit (HOSPITAL_COMMUNITY)
Admission: RE | Admit: 2014-06-21 | Discharge: 2014-06-21 | Disposition: A | Discharge status: Home / Self Care | Payer: Medicaid Other | Source: Ambulatory Visit | Attending: Family Medicine | Admitting: Family Medicine

## 2014-06-21 DIAGNOSIS — O09293 Supervision of pregnancy with other poor reproductive or obstetric history, third trimester: Secondary | ICD-10-CM | POA: Insufficient documentation

## 2014-06-21 DIAGNOSIS — O3433 Maternal care for cervical incompetence, third trimester: Secondary | ICD-10-CM | POA: Diagnosis present

## 2014-06-21 DIAGNOSIS — Z8751 Personal history of pre-term labor: Secondary | ICD-10-CM

## 2014-06-21 DIAGNOSIS — Z3A31 31 weeks gestation of pregnancy: Secondary | ICD-10-CM | POA: Diagnosis not present

## 2014-06-21 DIAGNOSIS — O3432 Maternal care for cervical incompetence, second trimester: Secondary | ICD-10-CM

## 2014-07-03 ENCOUNTER — Encounter: Payer: Self-pay | Admitting: Obstetrics and Gynecology

## 2014-07-03 ENCOUNTER — Ambulatory Visit (INDEPENDENT_AMBULATORY_CARE_PROVIDER_SITE_OTHER): Payer: Medicaid Other | Admitting: Obstetrics and Gynecology

## 2014-07-03 VITALS — BP 124/68 | HR 112 | Temp 99.1°F | Wt 190.2 lb

## 2014-07-03 DIAGNOSIS — O3432 Maternal care for cervical incompetence, second trimester: Secondary | ICD-10-CM

## 2014-07-03 DIAGNOSIS — O3433 Maternal care for cervical incompetence, third trimester: Secondary | ICD-10-CM

## 2014-07-03 DIAGNOSIS — O0993 Supervision of high risk pregnancy, unspecified, third trimester: Secondary | ICD-10-CM

## 2014-07-03 DIAGNOSIS — O360131 Maternal care for anti-D [Rh] antibodies, third trimester, fetus 1: Secondary | ICD-10-CM

## 2014-07-03 LAB — POCT URINALYSIS DIP (DEVICE)
BILIRUBIN URINE: NEGATIVE
Glucose, UA: NEGATIVE mg/dL
KETONES UR: NEGATIVE mg/dL
NITRITE: NEGATIVE
PH: 7 (ref 5.0–8.0)
PROTEIN: NEGATIVE mg/dL
Specific Gravity, Urine: 1.02 (ref 1.005–1.030)
Urobilinogen, UA: 1 mg/dL (ref 0.0–1.0)

## 2014-07-03 NOTE — Progress Notes (Signed)
Patient is doing well without complaints. FM/PTL precautions reviewed.  

## 2014-07-03 NOTE — Progress Notes (Signed)
C/o of sharp pains mid-upper chest. Reports it does not feel like heartburn but did notice she did have gas at the same time. Reports chest pain started after she had a cold for 4 days (dry cough and congestion). States cold is gone now but last noticed the sharp pain this morning.

## 2014-07-17 ENCOUNTER — Ambulatory Visit (INDEPENDENT_AMBULATORY_CARE_PROVIDER_SITE_OTHER): Payer: Medicaid Other | Admitting: Family Medicine

## 2014-07-17 VITALS — BP 108/64 | HR 115 | Temp 98.3°F | Wt 196.4 lb

## 2014-07-17 DIAGNOSIS — O3433 Maternal care for cervical incompetence, third trimester: Secondary | ICD-10-CM

## 2014-07-17 DIAGNOSIS — O0993 Supervision of high risk pregnancy, unspecified, third trimester: Secondary | ICD-10-CM

## 2014-07-17 LAB — POCT URINALYSIS DIP (DEVICE)
BILIRUBIN URINE: NEGATIVE
GLUCOSE, UA: NEGATIVE mg/dL
Ketones, ur: NEGATIVE mg/dL
NITRITE: NEGATIVE
Protein, ur: NEGATIVE mg/dL
Specific Gravity, Urine: 1.015 (ref 1.005–1.030)
UROBILINOGEN UA: 1 mg/dL (ref 0.0–1.0)
pH: 7 (ref 5.0–8.0)

## 2014-07-17 NOTE — Progress Notes (Signed)
No problems or questions.  Denies bleeding, spotting, contractions.  Good fetal movement.

## 2014-07-17 NOTE — Progress Notes (Signed)
C/o of intermittent pelvic pressure.  

## 2014-07-17 NOTE — Patient Instructions (Signed)
Third Trimester of Pregnancy The third trimester is from week 29 through week 42, months 7 through 9. This trimester is when your unborn baby (fetus) is growing very fast. At the end of the ninth month, the unborn baby is about 20 inches in length. It weighs about 6-10 pounds.  HOME CARE   Avoid all smoking, herbs, and alcohol. Avoid drugs not approved by your doctor.  Only take medicine as told by your doctor. Some medicines are safe and some are not during pregnancy.  Exercise only as told by your doctor. Stop exercising if you start having cramps.  Eat regular, healthy meals.  Wear a good support bra if your breasts are tender.  Do not use hot tubs, steam rooms, or saunas.  Wear your seat belt when driving.  Avoid raw meat, uncooked cheese, and liter boxes and soil used by cats.  Take your prenatal vitamins.  Try taking medicine that helps you poop (stool softener) as needed, and if your doctor approves. Eat more fiber by eating fresh fruit, vegetables, and whole grains. Drink enough fluids to keep your pee (urine) clear or pale yellow.  Take warm water baths (sitz baths) to soothe pain or discomfort caused by hemorrhoids. Use hemorrhoid cream if your doctor approves.  If you have puffy, bulging veins (varicose veins), wear support hose. Raise (elevate) your feet for 15 minutes, 3-4 times a day. Limit salt in your diet.  Avoid heavy lifting, wear low heels, and sit up straight.  Rest with your legs raised if you have leg cramps or low back pain.  Visit your dentist if you have not gone during your pregnancy. Use a soft toothbrush to brush your teeth. Be gentle when you floss.  You can have sex (intercourse) unless your doctor tells you not to.  Do not travel far distances unless you must. Only do so with your doctor's approval.  Take prenatal classes.  Practice driving to the hospital.  Pack your hospital bag.  Prepare the baby's room.  Go to your doctor visits. GET  HELP IF:  You are not sure if you are in labor or if your water has broken.  You are dizzy.  You have mild cramps or pressure in your lower belly (abdominal).  You have a nagging pain in your belly area.  You continue to feel sick to your stomach (nauseous), throw up (vomit), or have watery poop (diarrhea).  You have bad smelling fluid coming from your vagina.  You have pain with peeing (urination). GET HELP RIGHT AWAY IF:   You have a fever.  You are leaking fluid from your vagina.  You are spotting or bleeding from your vagina.  You have severe belly cramping or pain.  You lose or gain weight rapidly.  You have trouble catching your breath and have chest pain.  You notice sudden or extreme puffiness (swelling) of your face, hands, ankles, feet, or legs.  You have not felt the baby move in over an hour.  You have severe headaches that do not go away with medicine.  You have vision changes. Document Released: 09/30/2009 Document Revised: 10/31/2012 Document Reviewed: 09/06/2012 ExitCare Patient Information 2015 Exit[employer]. This information is not intended to replace advice given to you by your health care provider. Make sure you discuss any questions you have with your health care provider.  

## 2014-07-19 ENCOUNTER — Ambulatory Visit (HOSPITAL_COMMUNITY)
Admission: RE | Admit: 2014-07-19 | Discharge: 2014-07-19 | Disposition: A | Discharge status: Home / Self Care | Payer: Medicaid Other | Source: Ambulatory Visit | Attending: Family Medicine | Admitting: Family Medicine

## 2014-07-19 DIAGNOSIS — Z8751 Personal history of pre-term labor: Secondary | ICD-10-CM | POA: Insufficient documentation

## 2014-07-19 DIAGNOSIS — Z3A35 35 weeks gestation of pregnancy: Secondary | ICD-10-CM | POA: Insufficient documentation

## 2014-07-19 DIAGNOSIS — O3433 Maternal care for cervical incompetence, third trimester: Secondary | ICD-10-CM | POA: Diagnosis present

## 2014-07-20 NOTE — L&D Delivery Note (Signed)
Delivery Note  Ms. Rhonda Gordon is a G2P1101 who presented at 40.5 wks in active labor.  Pt had cervical cerclage placed in July 2015 due to cervical incompetence, but otherwise had normal course of pregnancy.  She presented around 11 AM in active labor at around 6 cm, progressed to complete by 2:00 PM and had SROM around 2:00 PM as well.  She pushed for about 30-45 minutes before delivery of baby girl.   At 3:09 PM a viable female was delivered via  NSVD (Presentation: LOA).  APGAR:  pending; weight  pending.   Placenta status: intact, spontaneous delivery .  Cord:  3 vessel with the following complications: none.  Cord pH: N/A  Anesthesia:  Epidural  Episiotomy:  None  Lacerations:  2nd degree Suture Repair: 3.0 vicryl rapide Est. Blood Loss (mL):  300 cc   Mom to postpartum.  Baby to Couplet care / Skin to Skin.  Gildardo CrankerHess, Rhonda Gordon 08/28/2014, 3:37 PM

## 2014-07-21 DIAGNOSIS — Z8751 Personal history of pre-term labor: Secondary | ICD-10-CM | POA: Insufficient documentation

## 2014-07-21 DIAGNOSIS — Z3A35 35 weeks gestation of pregnancy: Secondary | ICD-10-CM | POA: Insufficient documentation

## 2014-07-21 DIAGNOSIS — O3433 Maternal care for cervical incompetence, third trimester: Secondary | ICD-10-CM | POA: Insufficient documentation

## 2014-07-26 ENCOUNTER — Ambulatory Visit (INDEPENDENT_AMBULATORY_CARE_PROVIDER_SITE_OTHER): Payer: Medicaid Other | Admitting: Family

## 2014-07-26 VITALS — BP 130/69 | HR 108 | Temp 98.9°F | Wt 194.9 lb

## 2014-07-26 DIAGNOSIS — O3433 Maternal care for cervical incompetence, third trimester: Secondary | ICD-10-CM

## 2014-07-26 LAB — POCT URINALYSIS DIP (DEVICE)
BILIRUBIN URINE: NEGATIVE
GLUCOSE, UA: NEGATIVE mg/dL
Nitrite: NEGATIVE
Protein, ur: NEGATIVE mg/dL
Specific Gravity, Urine: 1.02 (ref 1.005–1.030)
UROBILINOGEN UA: 1 mg/dL (ref 0.0–1.0)
pH: 7 (ref 5.0–8.0)

## 2014-07-26 MED ORDER — NITROFURANTOIN MONOHYD MACRO 100 MG PO CAPS: 100.0000 mg | ORAL_CAPSULE | Freq: Two times a day (BID) | ORAL | Status: DC

## 2014-07-26 NOTE — Progress Notes (Signed)
Doing well; no questions or concerns.  No UTI symptoms. Urine culture sent.  Pt declines speculum exam, will defer until next week for cerclage removal.

## 2014-07-28 LAB — CULTURE, OB URINE: Colony Count: 70000

## 2014-07-28 LAB — GC/CHLAMYDIA PROBE AMP
CT PROBE, AMP APTIMA: NEGATIVE
GC PROBE AMP APTIMA: NEGATIVE

## 2014-07-29 LAB — CULTURE, BETA STREP (GROUP B ONLY)

## 2014-08-06 ENCOUNTER — Ambulatory Visit (INDEPENDENT_AMBULATORY_CARE_PROVIDER_SITE_OTHER): Payer: Medicaid Other | Admitting: Obstetrics & Gynecology

## 2014-08-06 VITALS — BP 129/69 | HR 96 | Temp 98.0°F | Wt 195.6 lb

## 2014-08-06 DIAGNOSIS — Z8751 Personal history of pre-term labor: Secondary | ICD-10-CM

## 2014-08-06 DIAGNOSIS — O3433 Maternal care for cervical incompetence, third trimester: Secondary | ICD-10-CM

## 2014-08-06 DIAGNOSIS — O3432 Maternal care for cervical incompetence, second trimester: Secondary | ICD-10-CM

## 2014-08-06 LAB — POCT URINALYSIS DIP (DEVICE)
BILIRUBIN URINE: NEGATIVE
Glucose, UA: NEGATIVE mg/dL
Hgb urine dipstick: NEGATIVE
Ketones, ur: 15 mg/dL — AB
Nitrite: NEGATIVE
Protein, ur: NEGATIVE mg/dL
Specific Gravity, Urine: 1.02 (ref 1.005–1.030)
UROBILINOGEN UA: 1 mg/dL (ref 0.0–1.0)
pH: 7 (ref 5.0–8.0)

## 2014-08-06 NOTE — Progress Notes (Signed)
Cerclage removed today, small amount of bleeding from site Cervix 3/70/-3/vertex/ballotable. No other complaints or concerns.  Labor and fetal movement precautions reviewed.

## 2014-08-14 ENCOUNTER — Ambulatory Visit (INDEPENDENT_AMBULATORY_CARE_PROVIDER_SITE_OTHER): Payer: Medicaid Other | Admitting: Family Medicine

## 2014-08-14 VITALS — BP 127/77 | HR 111 | Temp 98.2°F | Wt 199.6 lb

## 2014-08-14 DIAGNOSIS — O0993 Supervision of high risk pregnancy, unspecified, third trimester: Secondary | ICD-10-CM

## 2014-08-14 LAB — POCT URINALYSIS DIP (DEVICE)
Bilirubin Urine: NEGATIVE
GLUCOSE, UA: NEGATIVE mg/dL
Ketones, ur: NEGATIVE mg/dL
NITRITE: NEGATIVE
PH: 7 (ref 5.0–8.0)
Protein, ur: NEGATIVE mg/dL
Specific Gravity, Urine: 1.01 (ref 1.005–1.030)
UROBILINOGEN UA: 0.2 mg/dL (ref 0.0–1.0)

## 2014-08-14 NOTE — Progress Notes (Signed)
C/o pelvic pressure and occasional, mild, contractions.

## 2014-08-14 NOTE — Patient Instructions (Signed)
Third Trimester of Pregnancy The third trimester is from week 29 through week 42, months 7 through 9. The third trimester is a time when the fetus is growing rapidly. At the end of the ninth month, the fetus is about 20 inches in length and weighs 6-10 pounds.  BODY CHANGES Your body goes through many changes during pregnancy. The changes vary from woman to woman.   Your weight will continue to increase. You can expect to gain 25-35 pounds (11-16 kg) by the end of the pregnancy.  You may begin to get stretch marks on your hips, abdomen, and breasts.  You may urinate more often because the fetus is moving lower into your pelvis and pressing on your bladder.  You may develop or continue to have heartburn as a result of your pregnancy.  You may develop constipation because certain hormones are causing the muscles that push waste through your intestines to slow down.  You may develop hemorrhoids or swollen, bulging veins (varicose veins).  You may have pelvic pain because of the weight gain and pregnancy hormones relaxing your joints between the bones in your pelvis. Backaches may result from overexertion of the muscles supporting your posture.  You may have changes in your hair. These can include thickening of your hair, rapid growth, and changes in texture. Some women also have hair loss during or after pregnancy, or hair that feels dry or thin. Your hair will most likely return to normal after your baby is born.  Your breasts will continue to grow and be tender. A yellow discharge may leak from your breasts called colostrum.  Your belly button may stick out.  You may feel short of breath because of your expanding uterus.  You may notice the fetus "dropping," or moving lower in your abdomen.  You may have a bloody mucus discharge. This usually occurs a few days to a week before labor begins.  Your cervix becomes thin and soft (effaced) near your due date. WHAT TO EXPECT AT YOUR PRENATAL  EXAMS  You will have prenatal exams every 2 weeks until week 36. Then, you will have weekly prenatal exams. During a routine prenatal visit:  You will be weighed to make sure you and the fetus are growing normally.  Your blood pressure is taken.  Your abdomen will be measured to track your baby's growth.  The fetal heartbeat will be listened to.  Any test results from the previous visit will be discussed.  You may have a cervical check near your due date to see if you have effaced. At around 36 weeks, your caregiver will check your cervix. At the same time, your caregiver will also perform a test on the secretions of the vaginal tissue. This test is to determine if a type of bacteria, Group B streptococcus, is present. Your caregiver will explain this further. Your caregiver may ask you:  What your birth plan is.  How you are feeling.  If you are feeling the baby move.  If you have had any abnormal symptoms, such as leaking fluid, bleeding, severe headaches, or abdominal cramping.  If you have any questions. Other tests or screenings that may be performed during your third trimester include:  Blood tests that check for low iron levels (anemia).  Fetal testing to check the health, activity level, and growth of the fetus. Testing is done if you have certain medical conditions or if there are problems during the pregnancy. FALSE LABOR You may feel small, irregular contractions that   eventually go away. These are called Braxton Hicks contractions, or false labor. Contractions may last for hours, days, or even weeks before true labor sets in. If contractions come at regular intervals, intensify, or become painful, it is best to be seen by your caregiver.  SIGNS OF LABOR   Menstrual-like cramps.  Contractions that are 5 minutes apart or less.  Contractions that start on the top of the uterus and spread down to the lower abdomen and back.  A sense of increased pelvic pressure or back  pain.  A watery or bloody mucus discharge that comes from the vagina. If you have any of these signs before the 37th week of pregnancy, call your caregiver right away. You need to go to the hospital to get checked immediately. HOME CARE INSTRUCTIONS   Avoid all smoking, herbs, alcohol, and unprescribed drugs. These chemicals affect the formation and growth of the baby.  Follow your caregiver's instructions regarding medicine use. There are medicines that are either safe or unsafe to take during pregnancy.  Exercise only as directed by your caregiver. Experiencing uterine cramps is a good sign to stop exercising.  Continue to eat regular, healthy meals.  Wear a good support bra for breast tenderness.  Do not use hot tubs, steam rooms, or saunas.  Wear your seat belt at all times when driving.  Avoid raw meat, uncooked cheese, cat litter boxes, and soil used by cats. These carry germs that can cause birth defects in the baby.  Take your prenatal vitamins.  Try taking a stool softener (if your caregiver approves) if you develop constipation. Eat more high-fiber foods, such as fresh vegetables or fruit and whole grains. Drink plenty of fluids to keep your urine clear or pale yellow.  Take warm sitz baths to soothe any pain or discomfort caused by hemorrhoids. Use hemorrhoid cream if your caregiver approves.  If you develop varicose veins, wear support hose. Elevate your feet for 15 minutes, 3-4 times a day. Limit salt in your diet.  Avoid heavy lifting, wear low heal shoes, and practice good posture.  Rest a lot with your legs elevated if you have leg cramps or low back pain.  Visit your dentist if you have not gone during your pregnancy. Use a soft toothbrush to brush your teeth and be gentle when you floss.  A sexual relationship may be continued unless your caregiver directs you otherwise.  Do not travel far distances unless it is absolutely necessary and only with the approval  of your caregiver.  Take prenatal classes to understand, practice, and ask questions about the labor and delivery.  Make a trial run to the hospital.  Pack your hospital bag.  Prepare the baby's nursery.  Continue to go to all your prenatal visits as directed by your caregiver. SEEK MEDICAL CARE IF:  You are unsure if you are in labor or if your water has broken.  You have dizziness.  You have mild pelvic cramps, pelvic pressure, or nagging pain in your abdominal area.  You have persistent nausea, vomiting, or diarrhea.  You have a bad smelling vaginal discharge.  You have pain with urination. SEEK IMMEDIATE MEDICAL CARE IF:   You have a fever.  You are leaking fluid from your vagina.  You have spotting or bleeding from your vagina.  You have severe abdominal cramping or pain.  You have rapid weight loss or gain.  You have shortness of breath with chest pain.  You notice sudden or extreme swelling   of your face, hands, ankles, feet, or legs.  You have not felt your baby move in over an hour.  You have severe headaches that do not go away with medicine.  You have vision changes. Document Released: 06/30/2001 Document Revised: 07/11/2013 Document Reviewed: 09/06/2012 ExitCare Patient Information 2015 Exit[employer]. This information is not intended to replace advice given to you by your health care provider. Make sure you discuss any questions you have with your health care provider.  

## 2014-08-14 NOTE — Progress Notes (Signed)
Irregular light contractions.  No concerns.  Labor precautions and fetal movement precautions given.

## 2014-08-17 ENCOUNTER — Inpatient Hospital Stay (HOSPITAL_COMMUNITY)
Admission: AD | Admit: 2014-08-17 | Discharge: 2014-08-17 | Disposition: A | Discharge status: Home / Self Care | Payer: Medicaid Other | Source: Ambulatory Visit | Attending: Obstetrics & Gynecology | Admitting: Obstetrics & Gynecology

## 2014-08-17 ENCOUNTER — Encounter (HOSPITAL_COMMUNITY): Payer: Self-pay | Admitting: *Deleted

## 2014-08-17 DIAGNOSIS — O9989 Other specified diseases and conditions complicating pregnancy, childbirth and the puerperium: Secondary | ICD-10-CM | POA: Diagnosis not present

## 2014-08-17 DIAGNOSIS — M549 Dorsalgia, unspecified: Secondary | ICD-10-CM | POA: Diagnosis not present

## 2014-08-17 DIAGNOSIS — Z3A39 39 weeks gestation of pregnancy: Secondary | ICD-10-CM | POA: Insufficient documentation

## 2014-08-17 DIAGNOSIS — N949 Unspecified condition associated with female genital organs and menstrual cycle: Secondary | ICD-10-CM | POA: Diagnosis not present

## 2014-08-17 LAB — POCT FERN TEST: POCT Fern Test: NEGATIVE

## 2014-08-17 NOTE — MAU Note (Addendum)
Patient presents at 5639 weeks gestation with complaint of pelvic pain since [redacted] weeks gestation; back pain X one week and a trickle of fluid today x 3 times. Fetus active.

## 2014-08-17 NOTE — Discharge Instructions (Signed)

## 2014-08-21 ENCOUNTER — Ambulatory Visit (INDEPENDENT_AMBULATORY_CARE_PROVIDER_SITE_OTHER): Payer: Medicaid Other | Admitting: Family Medicine

## 2014-08-21 VITALS — BP 136/77 | HR 103 | Temp 98.8°F | Wt 201.1 lb

## 2014-08-21 DIAGNOSIS — Z3493 Encounter for supervision of normal pregnancy, unspecified, third trimester: Secondary | ICD-10-CM

## 2014-08-21 DIAGNOSIS — O0993 Supervision of high risk pregnancy, unspecified, third trimester: Secondary | ICD-10-CM

## 2014-08-21 DIAGNOSIS — Z3483 Encounter for supervision of other normal pregnancy, third trimester: Secondary | ICD-10-CM

## 2014-08-21 LAB — POCT URINALYSIS DIP (DEVICE)
Bilirubin Urine: NEGATIVE
Glucose, UA: NEGATIVE mg/dL
KETONES UR: NEGATIVE mg/dL
NITRITE: NEGATIVE
PH: 7 (ref 5.0–8.0)
Protein, ur: NEGATIVE mg/dL
Specific Gravity, Urine: 1.015 (ref 1.005–1.030)
Urobilinogen, UA: 0.2 mg/dL (ref 0.0–1.0)

## 2014-08-21 NOTE — Progress Notes (Signed)
Pt doing well today, denies contractions.  Seen in MAU 2 days ago with negative Fern.  Denies LOF, vaginal bleeding, d/c, or HA.  A/P 39.5 wks.  NST in two days and repeat NST/OB in 1 wk.  IOL @ 41 wks if has not gone into labor at that point   HartBryan R. Paulina FusiHess, DO [facility] 08/21/2014, 1:29 PM

## 2014-08-21 NOTE — Progress Notes (Signed)
Patient seen and examined.  Agree with resident note.

## 2014-08-21 NOTE — Progress Notes (Signed)
Pt desires cervical exam 

## 2014-08-24 ENCOUNTER — Ambulatory Visit (INDEPENDENT_AMBULATORY_CARE_PROVIDER_SITE_OTHER): Payer: Medicaid Other | Admitting: *Deleted

## 2014-08-24 VITALS — BP 129/75 | HR 117

## 2014-08-24 DIAGNOSIS — O480 Post-term pregnancy: Secondary | ICD-10-CM

## 2014-08-28 ENCOUNTER — Inpatient Hospital Stay (HOSPITAL_COMMUNITY)
Admission: AD | Admit: 2014-08-28 | Discharge: 2014-08-30 | DRG: 775 | Disposition: A | Discharge status: Home / Self Care | Payer: Medicaid Other | Source: Ambulatory Visit | Attending: Family Medicine | Admitting: Family Medicine

## 2014-08-28 ENCOUNTER — Inpatient Hospital Stay (HOSPITAL_COMMUNITY): Payer: Medicaid Other | Admitting: Anesthesiology

## 2014-08-28 ENCOUNTER — Other Ambulatory Visit: Payer: Medicaid Other

## 2014-08-28 ENCOUNTER — Encounter (HOSPITAL_COMMUNITY): Payer: Self-pay | Admitting: General Practice

## 2014-08-28 DIAGNOSIS — O360131 Maternal care for anti-D [Rh] antibodies, third trimester, fetus 1: Secondary | ICD-10-CM

## 2014-08-28 DIAGNOSIS — IMO0001 Reserved for inherently not codable concepts without codable children: Secondary | ICD-10-CM

## 2014-08-28 DIAGNOSIS — O3433 Maternal care for cervical incompetence, third trimester: Secondary | ICD-10-CM

## 2014-08-28 DIAGNOSIS — Z3A40 40 weeks gestation of pregnancy: Secondary | ICD-10-CM | POA: Diagnosis present

## 2014-08-28 DIAGNOSIS — O480 Post-term pregnancy: Secondary | ICD-10-CM | POA: Diagnosis present

## 2014-08-28 DIAGNOSIS — Z3483 Encounter for supervision of other normal pregnancy, third trimester: Secondary | ICD-10-CM | POA: Diagnosis present

## 2014-08-28 DIAGNOSIS — O0993 Supervision of high risk pregnancy, unspecified, third trimester: Secondary | ICD-10-CM

## 2014-08-28 DIAGNOSIS — O360930 Maternal care for other rhesus isoimmunization, third trimester, not applicable or unspecified: Secondary | ICD-10-CM | POA: Diagnosis not present

## 2014-08-28 LAB — CBC
HEMATOCRIT: 32.8 % — AB (ref 36.0–46.0)
Hemoglobin: 10.6 g/dL — ABNORMAL LOW (ref 12.0–15.0)
MCH: 26.8 pg (ref 26.0–34.0)
MCHC: 32.3 g/dL (ref 30.0–36.0)
MCV: 83 fL (ref 78.0–100.0)
Platelets: 202 10*3/uL (ref 150–400)
RBC: 3.95 MIL/uL (ref 3.87–5.11)
RDW: 14.7 % (ref 11.5–15.5)
WBC: 8.3 10*3/uL (ref 4.0–10.5)

## 2014-08-28 LAB — OB RESULTS CONSOLE GBS: GBS: NEGATIVE

## 2014-08-28 MED ORDER — BENZOCAINE-MENTHOL 20-0.5 % EX AERO
1.0000 "application " | INHALATION_SPRAY | CUTANEOUS | Status: DC | PRN
  Administered 2014-08-30: 1 via TOPICAL
  Filled 2014-08-28 (×2): qty 56

## 2014-08-28 MED ORDER — FENTANYL 2.5 MCG/ML BUPIVACAINE 1/10 % EPIDURAL INFUSION (WH - ANES)
14.0000 mL/h | INTRAMUSCULAR | Status: DC | PRN
  Administered 2014-08-28: 14 mL/h via EPIDURAL
  Filled 2014-08-28: qty 125

## 2014-08-28 MED ORDER — ONDANSETRON HCL 4 MG/2ML IJ SOLN: 4.0000 mg | INTRAMUSCULAR | Status: DC | PRN

## 2014-08-28 MED ORDER — LACTATED RINGERS IV SOLN
500.0000 mL | INTRAVENOUS | Status: DC | PRN
  Administered 2014-08-28: 1000 mL via INTRAVENOUS

## 2014-08-28 MED ORDER — LACTATED RINGERS IV SOLN: 500.0000 mL | Freq: Once | INTRAVENOUS | Status: DC

## 2014-08-28 MED ORDER — LIDOCAINE HCL (PF) 1 % IJ SOLN
INTRAMUSCULAR | Status: AC
  Filled 2014-08-28: qty 30

## 2014-08-28 MED ORDER — EPHEDRINE 5 MG/ML INJ
10.0000 mg | INTRAVENOUS | Status: DC | PRN
  Filled 2014-08-28: qty 2

## 2014-08-28 MED ORDER — SIMETHICONE 80 MG PO CHEW
80.0000 mg | CHEWABLE_TABLET | ORAL | Status: DC | PRN
Start: 2014-08-28 — End: 2014-08-30

## 2014-08-28 MED ORDER — LANOLIN HYDROUS EX OINT: TOPICAL_OINTMENT | CUTANEOUS | Status: DC | PRN

## 2014-08-28 MED ORDER — WITCH HAZEL-GLYCERIN EX PADS: 1.0000 "application " | MEDICATED_PAD | CUTANEOUS | Status: DC | PRN

## 2014-08-28 MED ORDER — INFLUENZA VAC SPLIT QUAD 0.5 ML IM SUSY
0.5000 mL | PREFILLED_SYRINGE | INTRAMUSCULAR | Status: AC
  Administered 2014-08-29: 0.5 mL via INTRAMUSCULAR

## 2014-08-28 MED ORDER — LIDOCAINE HCL (PF) 1 % IJ SOLN
INTRAMUSCULAR | Status: DC | PRN
  Administered 2014-08-28 (×2): 4 mL

## 2014-08-28 MED ORDER — PHENYLEPHRINE 40 MCG/ML (10ML) SYRINGE FOR IV PUSH (FOR BLOOD PRESSURE SUPPORT)
80.0000 ug | PREFILLED_SYRINGE | INTRAVENOUS | Status: DC | PRN
  Filled 2014-08-28: qty 2

## 2014-08-28 MED ORDER — DIPHENHYDRAMINE HCL 25 MG PO CAPS: 25.0000 mg | ORAL_CAPSULE | Freq: Four times a day (QID) | ORAL | Status: DC | PRN

## 2014-08-28 MED ORDER — PHENYLEPHRINE 40 MCG/ML (10ML) SYRINGE FOR IV PUSH (FOR BLOOD PRESSURE SUPPORT)
80.0000 ug | PREFILLED_SYRINGE | INTRAVENOUS | Status: DC | PRN
  Filled 2014-08-28: qty 20
  Filled 2014-08-28: qty 2

## 2014-08-28 MED ORDER — DIPHENHYDRAMINE HCL 50 MG/ML IJ SOLN: 12.5000 mg | INTRAMUSCULAR | Status: DC | PRN

## 2014-08-28 MED ORDER — DIBUCAINE 1 % RE OINT: 1.0000 "application " | TOPICAL_OINTMENT | RECTAL | Status: DC | PRN

## 2014-08-28 MED ORDER — TETANUS-DIPHTH-ACELL PERTUSSIS 5-2.5-18.5 LF-MCG/0.5 IM SUSP: 0.5000 mL | Freq: Once | INTRAMUSCULAR | Status: DC

## 2014-08-28 MED ORDER — ZOLPIDEM TARTRATE 5 MG PO TABS: 5.0000 mg | ORAL_TABLET | Freq: Every evening | ORAL | Status: DC | PRN

## 2014-08-28 MED ORDER — BUTORPHANOL TARTRATE 1 MG/ML IJ SOLN
1.0000 mg | Freq: Once | INTRAMUSCULAR | Status: AC
  Administered 2014-08-28: 1 mg via INTRAVENOUS
  Filled 2014-08-28: qty 1

## 2014-08-28 MED ORDER — IBUPROFEN 600 MG PO TABS
600.0000 mg | ORAL_TABLET | Freq: Four times a day (QID) | ORAL | Status: DC
  Administered 2014-08-28 – 2014-08-30 (×8): 600 mg via ORAL
  Filled 2014-08-28 (×8): qty 1

## 2014-08-28 MED ORDER — OXYTOCIN 40 UNITS IN LACTATED RINGERS INFUSION - SIMPLE MED
INTRAVENOUS | Status: AC
  Administered 2014-08-28: 40 [IU]
  Filled 2014-08-28: qty 1000

## 2014-08-28 MED ORDER — FENTANYL 2.5 MCG/ML BUPIVACAINE 1/10 % EPIDURAL INFUSION (WH - ANES)
INTRAMUSCULAR | Status: DC | PRN
  Administered 2014-08-28: 14 mL/h via EPIDURAL

## 2014-08-28 MED ORDER — ONDANSETRON HCL 4 MG PO TABS: 4.0000 mg | ORAL_TABLET | ORAL | Status: DC | PRN

## 2014-08-28 MED ORDER — SENNOSIDES-DOCUSATE SODIUM 8.6-50 MG PO TABS
2.0000 | ORAL_TABLET | ORAL | Status: DC
  Administered 2014-08-28 – 2014-08-30 (×2): 2 via ORAL
  Filled 2014-08-28 (×2): qty 2

## 2014-08-28 MED ORDER — PRENATAL MULTIVITAMIN CH
1.0000 | ORAL_TABLET | Freq: Every day | ORAL | Status: DC
  Administered 2014-08-29: 1 via ORAL
  Filled 2014-08-28 (×2): qty 1

## 2014-08-28 NOTE — H&P (Signed)
Chief Complaint:  Rupture of Membranes and Contractions   None    HPI: Rhonda Gordon is a 24 y.o. G2P0100 at [redacted]w[redacted]d who presents to maternity admissions reporting increased frequency and strength of contractions. Patient had significant cervical change while in the MAU. Denied leakage of fluid, or significant vaginal bleeding. Contractions became more uncomfortable in MAU. Decision was made to admit patient. Good fetal movement.   Pregnancy Course:  Clinic HRC  Dating LMP  Genetic Screen Declined  Anatomic [place] Normal  GTT Early:               Third trimester: 127  TDaP vaccine   Flu vaccine 08/21/13  GBS  Neg  Contraception  Nexplanon  Baby Food  breast  Pediatrician   Support Person FOB    Past Medical History: Past Medical History  Diagnosis Date  . SVD (spontaneous vaginal delivery) 08/2013    svd - fetal loss at 27 wks  . Preterm labor     Past obstetric history: OB History  Gravida Para Term Preterm AB SAB TAB Ectopic Multiple Living  0    # Outcome Date GA Lbr Len/2nd Weight Sex Delivery Anes PTL Lv  2 Current           1 Preterm 09/15/13 [redacted]w[redacted]d -14:11 / 00:03 0.374 kg (13.2 oz) M Vag-Spont None  FD      Past Surgical History: Past Surgical History  Procedure Laterality Date  . Tooth extraction    . Cervical cerclage N/A 02/15/2014    Procedure: CERCLAGE CERVICAL;  Surgeon: Catalina Antigua, MD;  Location: [facility];  Service: Gynecology;  Laterality: N/A;     Family History: Family History  Problem Relation Age of Onset  . Hypertension Mother   . Hypertension Sister     Social History: History  Substance Use Topics  . Smoking status: Never Smoker   . Smokeless tobacco: Never Used  . Alcohol Use: No    Allergies: No Known Allergies  Meds:  Prescriptions prior to admission  Medication Sig Dispense Refill Last Dose  . Prenatal Vit-Fe Fumarate-FA (PRENATAL VITAMINS) 28-0.8 MG TABS Take 1 tablet by mouth daily.    08/28/2014 at Unknown time  .  nitrofurantoin, macrocrystal-monohydrate, (MACROBID) 100 MG capsule Take 1 capsule (100 mg total) by mouth 2 (two) times daily. (Patient not taking: Reported on 08/06/2014) 14 capsule 1 Not Taking    ROS: Pertinent findings in history of present illness.  Physical Exam  Blood pressure 110/81, pulse 69, temperature 98.8 F (37.1 C), temperature source Oral, resp. rate 18, height  (1.676 m), weight 91.627 kg (202 lb), last menstrual period 11/16/2013. GENERAL: Well-developed, well-nourished female in no acute distress.  HEENT: normocephalic HEART: normal rate RESP: normal effort ABDOMEN: Soft, non-tender, gravid appropriate for gestational age EXTREMITIES: Nontender, no edema NEURO: alert and oriented  Dilation: 8 Effacement (%): 100 Cervical Position: Anterior Station: -2 Presentation: Vertex Exam by:: [email or web address] rnc  FHT:  Baseline 145, moderate variability, accelerations present, no decelerations Contractions: q 3-4 mins   Labs: Results for orders placed or performed during the hospital encounter of 08/28/14 (from the past 24 hour(s))  CBC     Status: Abnormal   Collection Time: 08/28/14 11:48 AM  Result Value Ref Range   WBC 8.3 4.0 - 10.5 K/uL   RBC 3.95 3.87 - 5.11 MIL/uL   Hemoglobin 10.6 (L) 12.0 - 15.0 g/dL   HCT 16.1 (L) 09.6 -  46.0 %   MCV 83.0 78.0 - 100.0 fL   MCH 26.8 26.0 - 34.0 pg   MCHC 32.3 30.0 - 36.0 g/dL   RDW 82.914.7 56.211.5 - 13.015.5 %   Platelets 202 150 - 400 K/uL  OB RESULT CONSOLE Group B Strep     Status: None   Collection Time: 08/28/14 12:20 PM  Result Value Ref Range   GBS Negative     Imaging:  No results found.  Assessment: 1. Supervision of high-risk pregnancy, third trimester   2. Incompetent cervix in pregnancy, antepartum, third trimester   3. Rh negative state in antepartum period, third trimester, fetus 1   4. Labor: active 5. Fetal Wellbeing: Category 1  6. Pain Control: epidural 7. GBS: neg 8. 40.5 week IUP  Plan:  1. Admit  to BS per consult with MD 2. Routine L&D orders 3. Analgesia/anesthesia PRN      Medication List    ASK your doctor about these medications        nitrofurantoin (macrocrystal-monohydrate) 100 MG capsule  Commonly known as:  MACROBID  Take 1 capsule (100 mg total) by mouth 2 (two) times daily.     Prenatal Vitamins 28-0.8 MG Tabs  Take 1 tablet by mouth daily.        Kathee DeltonIan D McKeag, MD 08/28/2014 1:47 PM   OB fellow attestation:  I have seen and examined this patient; I agree with above documentation in the resident's note.   Rhonda Gordon is a 24 y.o. G2P0100 here in active labor  PE: BP 124/75 mmHg  Pulse 99  Temp(Src) 98.8 F (37.1 C) (Oral)  Resp 18  Ht 5\' 6"  (1.676 m)  Wt 202 lb (91.627 kg)  BMI 32.62 kg/m2  LMP 11/16/2013 Gen: moderate distress with contractions Resp: normal effort, no distress Abd: gravid  ROS, labs, PMH reviewed  Plan: MOF: breast MOC: nexpl ID: GBS neg FWB: cat I Labor: expectant mgmt, made cervical change in MAU Pain: epidural upon request  Rhonda Gordon 08/28/2014, 2:30 PM

## 2014-08-28 NOTE — Anesthesia Procedure Notes (Signed)
Epidural Patient location during procedure: OB Start time: 08/28/2014 12:47 PM  Staffing Anesthesiologist: Kohl Polinsky A. Performed by: anesthesiologist   Preanesthetic Checklist Completed: patient identified, site marked, surgical consent, pre-op evaluation, timeout performed, IV checked, risks and benefits discussed and monitors and equipment checked  Epidural Patient position: sitting Prep: site prepped and draped and DuraPrep Patient monitoring: continuous pulse ox and blood pressure Approach: midline Location: L3-L4 Injection technique: LOR air  Needle:  Needle type: Tuohy  Needle gauge: 17 G Needle length: 9 cm and 9 Needle insertion depth: 6 cm Catheter type: closed end flexible Catheter size: 19 Gauge Catheter at skin depth: 11 cm Test dose: negative and Other  Assessment Events: blood not aspirated, injection not painful, no injection resistance, negative IV test and no paresthesia  Additional Notes Patient identified. Risks and benefits discussed including failed block, incomplete  Pain control, post dural puncture headache, nerve damage, paralysis, blood pressure Changes, nausea, vomiting, reactions to medications-both toxic and allergic and post Partum back pain. All questions were answered. Patient expressed understanding and wished to proceed. Sterile technique was used throughout procedure. Epidural site was Dressed with sterile barrier dressing. No paresthesias, signs of intravascular injection Or signs of intrathecal spread were encountered.  Patient was more comfortable after the epidural was dosed. Please see RN's note for documentation of vital signs and FHR which are stable.

## 2014-08-28 NOTE — Anesthesia Preprocedure Evaluation (Signed)
Anesthesia Evaluation  Patient identified by MRN, date of birth, ID band Patient awake    Reviewed: Allergy & Precautions, Patient's Chart, lab work & pertinent test results  Airway Mallampati: II  TM Distance: >3 FB Neck ROM: Full    Dental no notable dental hx. (+) Teeth Intact   Pulmonary neg pulmonary ROS,  breath sounds clear to auscultation  Pulmonary exam normal       Cardiovascular negative cardio ROS  Rhythm:Regular Rate:Normal     Neuro/Psych negative neurological ROS  negative psych ROS   GI/Hepatic negative GI ROS, Neg liver ROS,   Endo/Other  Obesity  Renal/GU negative Renal ROS  negative genitourinary   Musculoskeletal negative musculoskeletal ROS (+)   Abdominal (+) + obese,   Peds  Hematology  (+) anemia ,   Anesthesia Other Findings   Reproductive/Obstetrics (+) Pregnancy Hx/o PTL                             Anesthesia Physical Anesthesia Plan  ASA: II  Anesthesia Plan: Epidural   Post-op Pain Management:    Induction:   Airway Management Planned: Natural Airway  Additional Equipment:   Intra-op Plan:   Post-operative Plan:   Informed Consent: I have reviewed the patients History and Physical, chart, labs and discussed the procedure including the risks, benefits and alternatives for the proposed anesthesia with the patient or authorized representative who has indicated his/her understanding and acceptance.     Plan Discussed with: Anesthesiologist  Anesthesia Plan Comments:         Anesthesia Quick Evaluation

## 2014-08-28 NOTE — MAU Note (Signed)
Pt presents to MAU with c/o water breaking and having contractions every 7-8 minutes

## 2014-08-28 NOTE — Progress Notes (Signed)
Delivery of live viable female by Dr Paulina FusiHess, assisted by Dr Loreta AveAcosta. APGARS 7, 9

## 2014-08-29 LAB — RPR: RPR Ser Ql: NONREACTIVE

## 2014-08-29 MED ORDER — RHO D IMMUNE GLOBULIN 1500 UNIT/2ML IJ SOSY
300.0000 ug | PREFILLED_SYRINGE | Freq: Once | INTRAMUSCULAR | Status: AC
  Administered 2014-08-29: 300 ug via INTRAVENOUS
  Filled 2014-08-29: qty 2

## 2014-08-29 NOTE — Progress Notes (Signed)
UR chart review completed.  

## 2014-08-29 NOTE — Lactation Note (Signed)
This note was copied from the chart of Rhonda Gordon. Lactation Consultation Note  Patient Name: Rhonda Gordon: 08/29/2014   Baby 20 hours of life. LC entered room, mom has room full of visitors and is on the phone. LC introduced self and mom states that she is not going to breastfeed and is giving formula.  Maternal Data    Feeding Feeding Type: Breast Fed  LATCH Score/Interventions Latch: Grasps breast easily, tongue down, lips flanged, rhythmical sucking. Intervention(s): Adjust position;Assist with latch;Breast compression  Audible Swallowing: A few with stimulation Intervention(s): Skin to skin  Type of Nipple: Everted at rest and after stimulation  Comfort (Breast/Nipple): Soft / non-tender     Hold (Positioning): Full assist, staff holds infant at breast Intervention(s): Breastfeeding basics reviewed;Support Pillows;Position options;Skin to skin  LATCH Score: 7  Lactation Tools Discussed/Used     Consult Status      Geralynn OchsWILLIARD, Meri Pelot 08/29/2014, 12:02 PM

## 2014-08-29 NOTE — Progress Notes (Signed)
Post Partum Day #1 Subjective: no complaints, up ad lib and tolerating PO; now bottlefeeding; unsure re contraception- options rev'd  Objective: Blood pressure 124/70, pulse 87, temperature 97.8 F (36.6 C), temperature source Oral, resp. rate 17, height 5\' 6"  (1.676 m), weight 91.627 kg (202 lb), last menstrual period 11/16/2013, SpO2 100 %, unknown if currently breastfeeding.  Physical Exam:  General: alert, cooperative and no distress  Heart: RRR Lungs: nl effort Lochia: appropriate Uterine Fundus: firm DVT Evaluation: No evidence of DVT seen on physical exam.   Recent Labs  08/28/14 1148  HGB 10.6*  HCT 32.8*    Assessment/Plan: Plan for discharge tomorrow   LOS: 1 day   Cam HaiSHAW, KIMBERLY CNM 08/29/2014, 9:26 PM

## 2014-08-29 NOTE — Anesthesia Postprocedure Evaluation (Signed)
  Anesthesia Post-op Note  Patient: Rhonda Gordon  Procedure(s) Performed: * No procedures listed *  Patient Location: Mother/Baby  Anesthesia Type:Epidural  Level of Consciousness: awake, alert , oriented and patient cooperative  Airway and Oxygen Therapy: Patient Spontanous Breathing  Post-op Pain: mild  Post-op Assessment: Patient's Cardiovascular Status Stable, Respiratory Function Stable, No headache, No backache, No residual numbness and No residual motor weakness  Post-op Vital Signs: stable  Last Vitals:  Filed Vitals:   08/29/14 0558  BP: 118/69  Pulse: 79  Temp: 36.8 C  Resp: 16    Complications: No apparent anesthesia complications

## 2014-08-30 LAB — RH IG WORKUP (INCLUDES ABO/RH)
ABO/RH(D): A NEG
ANTIBODY SCREEN: NEGATIVE
Fetal Screen: NEGATIVE
Gestational Age(Wks): 40
Unit division: 0

## 2014-08-30 NOTE — Discharge Summary (Signed)
Obstetric Discharge Summary Reason for Admission: onset of labor Prenatal Procedures: cerclage Intrapartum Procedures: spontaneous vaginal delivery Postpartum Procedures: Rho(D) Ig Complications-Operative and Postpartum: 2nd degree perineal laceration HEMOGLOBIN  Date Value Ref Range Status  08/28/2014 10.6* 12.0 - 15.0 g/dL Final   HCT  Date Value Ref Range Status  08/28/2014 32.8* 36.0 - 46.0 % Final   Rhonda Gordon is a 24 year old G2P1101 PPD#2 SVD. She is up and moving with minimal pain that is well controlled with ibuprofen. She has voided and had bowel movements since delivery. She is tolerating PO. She had a girl, is bottle feeding and is undecided about birth control. Options for contraception were discussed and she plans on deciding at a later time.   Physical Exam:  General: alert and no distress Lochia: appropriate Uterine Fundus: firm Incision: N/A DVT Evaluation: No evidence of DVT seen on physical exam. No cords or calf tenderness.  Discharge Diagnoses: Term Pregnancy-delivered  Discharge Information: Date: 08/30/2014 Activity: unrestricted Diet: routine Medications: Ibuprofen Condition: stable Instructions: refer to practice specific booklet Discharge to: home   Follow-up Information    [facility] Schedule an appointment as soon as possible for a visit in 4 weeks.   Why:  For your postpartum appointment.   Contact information:   [address] [place] [id number] [phone]      Newborn Data: Live born female  Birth Weight: 8 lb 11.3 oz (3950 g) APGAR: 7, 9  Home with mother.  Rhonda Gordon 08/30/2014, 7:18 AM   I have seen and examined this patient and I agree with the above. Rhonda Gordon, Raymond Bhardwaj CNM 9:06 AM 08/30/2014

## 2014-08-30 NOTE — Discharge Instructions (Signed)

## 2014-10-03 ENCOUNTER — Ambulatory Visit: Payer: Medicaid Other | Admitting: Family Medicine

## 2015-04-16 ENCOUNTER — Emergency Department (HOSPITAL_COMMUNITY)
Admission: EM | Admit: 2015-04-16 | Discharge: 2015-04-16 | Disposition: A | Discharge status: Home / Self Care | Payer: No Typology Code available for payment source | Attending: Emergency Medicine | Admitting: Emergency Medicine

## 2015-04-16 ENCOUNTER — Encounter (HOSPITAL_COMMUNITY): Payer: Self-pay | Admitting: Emergency Medicine

## 2015-04-16 DIAGNOSIS — T148XXA Other injury of unspecified body region, initial encounter: Secondary | ICD-10-CM

## 2015-04-16 DIAGNOSIS — S199XXA Unspecified injury of neck, initial encounter: Secondary | ICD-10-CM | POA: Diagnosis not present

## 2015-04-16 DIAGNOSIS — S4991XA Unspecified injury of right shoulder and upper arm, initial encounter: Secondary | ICD-10-CM | POA: Diagnosis not present

## 2015-04-16 DIAGNOSIS — Y92410 Unspecified street and highway as the place of occurrence of the external cause: Secondary | ICD-10-CM | POA: Insufficient documentation

## 2015-04-16 DIAGNOSIS — T148 Other injury of unspecified body region: Secondary | ICD-10-CM | POA: Diagnosis not present

## 2015-04-16 DIAGNOSIS — Y9389 Activity, other specified: Secondary | ICD-10-CM | POA: Insufficient documentation

## 2015-04-16 DIAGNOSIS — Z79899 Other long term (current) drug therapy: Secondary | ICD-10-CM | POA: Insufficient documentation

## 2015-04-16 DIAGNOSIS — Y998 Other external cause status: Secondary | ICD-10-CM | POA: Diagnosis not present

## 2015-04-16 DIAGNOSIS — S0990XA Unspecified injury of head, initial encounter: Secondary | ICD-10-CM | POA: Diagnosis present

## 2015-04-16 MED ORDER — CYCLOBENZAPRINE HCL 10 MG PO TABS: 10.0000 mg | ORAL_TABLET | Freq: Two times a day (BID) | ORAL | Status: DC | PRN

## 2015-04-16 MED ORDER — IBUPROFEN 800 MG PO TABS: 800.0000 mg | ORAL_TABLET | Freq: Three times a day (TID) | ORAL | Status: DC
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
## 2015-04-17 ENCOUNTER — Emergency Department (HOSPITAL_COMMUNITY)
Admission: EM | Admit: 2015-04-17 | Discharge: 2015-04-17 | Disposition: A | Discharge status: Home / Self Care | Payer: No Typology Code available for payment source | Attending: Emergency Medicine | Admitting: Emergency Medicine

## 2015-04-17 ENCOUNTER — Encounter (HOSPITAL_COMMUNITY): Payer: Self-pay | Admitting: Vascular Surgery

## 2015-04-17 ENCOUNTER — Emergency Department (HOSPITAL_COMMUNITY): Payer: No Typology Code available for payment source

## 2015-04-17 DIAGNOSIS — M25511 Pain in right shoulder: Secondary | ICD-10-CM

## 2015-04-17 DIAGNOSIS — S4991XA Unspecified injury of right shoulder and upper arm, initial encounter: Secondary | ICD-10-CM | POA: Insufficient documentation

## 2015-04-17 DIAGNOSIS — Y939 Activity, unspecified: Secondary | ICD-10-CM | POA: Diagnosis not present

## 2015-04-17 DIAGNOSIS — Y999 Unspecified external cause status: Secondary | ICD-10-CM | POA: Diagnosis not present

## 2015-04-17 DIAGNOSIS — Y92410 Unspecified street and highway as the place of occurrence of the external cause: Secondary | ICD-10-CM | POA: Insufficient documentation

## 2015-04-17 DIAGNOSIS — Z791 Long term (current) use of non-steroidal anti-inflammatories (NSAID): Secondary | ICD-10-CM | POA: Diagnosis not present

## 2015-04-17 MED ORDER — IBUPROFEN 400 MG PO TABS
800.0000 mg | ORAL_TABLET | Freq: Once | ORAL | Status: AC
  Administered 2015-04-17: 800 mg via ORAL
  Filled 2015-04-17: qty 2

## 2015-04-17 MED ORDER — IBUPROFEN 400 MG PO TABS: 800.0000 mg | ORAL_TABLET | Freq: Once | ORAL | Status: DC

## 2015-04-17 NOTE — ED Notes (Signed)
Declined W/C at D/C and was escorted to lobby by RN. 
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
## 2015-04-17 NOTE — Discharge Instructions (Signed)
Shoulder Pain Follow up with a primary care provider using the resource guide below. Take Robaxin and anti-inflammatory's as needed for pain. The shoulder is the joint that connects your arms to your body. The bones that form the shoulder joint include the upper arm bone (humerus), the shoulder blade (scapula), and the collarbone (clavicle). The top of the humerus is shaped like a ball and fits into a rather flat socket on the scapula (glenoid cavity). A combination of muscles and strong, fibrous tissues that connect muscles to bones (tendons) support your shoulder joint and hold the ball in the socket. Small, fluid-filled sacs (bursae) are located in different areas of the joint. They act as cushions between the bones and the overlying soft tissues and help reduce friction between the gliding tendons and the bone as you move your arm. Your shoulder joint allows a wide range of motion in your arm. This range of motion allows you to do things like scratch your back or throw a ball. However, this range of motion also makes your shoulder more prone to pain from overuse and injury. Causes of shoulder pain can originate from both injury and overuse and usually can be grouped in the following four categories:  Redness, swelling, and pain (inflammation) of the tendon (tendinitis) or the bursae (bursitis).  Instability, such as a dislocation of the joint.  Inflammation of the joint (arthritis).  Broken bone (fracture). HOME CARE INSTRUCTIONS   Apply ice to the sore area.  Put ice in a plastic bag.  Place a towel between your skin and the bag.  Leave the ice on for 15-20 minutes, 3-4 times per day for the first 2 days, or as directed by your health care provider.  Stop using cold packs if they do not help with the pain.  If you have a shoulder sling or immobilizer, wear it as long as your caregiver instructs. Only remove it to shower or bathe. Move your arm as little as possible, but keep your hand  moving to prevent swelling.  Squeeze a soft ball or foam pad as much as possible to help prevent swelling.  Only take over-the-counter or prescription medicines for pain, discomfort, or fever as directed by your caregiver. SEEK MEDICAL CARE IF:   Your shoulder pain increases, or new pain develops in your arm, hand, or fingers.  Your hand or fingers become cold and numb.  Your pain is not relieved with medicines. SEEK IMMEDIATE MEDICAL CARE IF:   Your arm, hand, or fingers are numb or tingling.  Your arm, hand, or fingers are significantly swollen or turn white or blue. MAKE SURE YOU:   Understand these instructions.  Will watch your condition.  Will get help right away if you are not doing well or get worse. Document Released: 04/15/2005 Document Revised: 11/20/2013 Document Reviewed: 06/20/2011 [facility] Patient Information 2015 [place], [place]. This information is not intended to replace advice given to you by your health care provider. Make sure you discuss any questions you have with your health care provider.  Emergency Department Resource Guide 1) Find a Doctor and Pay Out of Pocket Although you won't have to find out who is covered by your insurance plan, it is a good idea to ask around and get recommendations. You will then need to call the office and see if the doctor you have chosen will accept you as a new patient and what types of options they offer for patients who are self-pay. Some doctors offer discounts or will  set up payment plans for their patients who do not have insurance, but you will need to ask so you aren't surprised when you get to your appointment.  2) Contact Your Local Health Department Not all health departments have doctors that can see patients for sick visits, but many do, so it is worth a call to see if yours does. If you don't know where your local health department is, you can check in your phone book. The CDC also has a tool to help you locate your  state's health department, and many state websites also have listings of all of their local health departments.  3) Find a Walk-in Clinic If your illness is not likely to be very severe or complicated, you may want to try a walk in clinic. These are popping up all over the country in pharmacies, drugstores, and shopping centers. They're usually staffed by nurse practitioners or physician assistants that have been trained to treat common illnesses and complaints. They're usually fairly quick and inexpensive. However, if you have serious medical issues or chronic medical problems, these are probably not your best option.  No Primary Care Doctor: - Call Health Connect at  [phone] - they can help you locate a primary care doctor that  accepts your insurance, provides certain services, etc. - Physician Referral Service- [phone]  Chronic Pain Problems: Organization         Address  Phone   Notes  [facility]  [phone] Patients need to be referred by their primary care doctor.   Medication Assistance: Organization         Address  Phone   Notes  [facility] Medication [facility] [address] [place] [place], [place] [address] [phone] --Must be a resident of [facility] -- Must have NO insurance coverage whatsoever (no Medicaid/ Medicare, etc.) -- The pt. MUST have a primary care doctor that directs their care regularly and follows them in the community   [facility]ist  [phone]   [employer]  [phone]    Agencies that provide inexpensive medical care: Organization         Address  Phone   Notes  [facility]  [phone]   [facility]    [phone]   [facility] [address] [place], [place] [address] [phone]   [facility] [address] [place] [place] [phone]   Planned Parenthood    [phone]   [facility]    [phone]   [facility]  [address], [place] Phone:  [phone], Fax:  [phone] Hours of Operation:  9 am - 6 pm, M-F.  Also accepts Medicaid/Medicare and self-pay.  [facility]  [address], [place] Phone: [phone], Fax: [phone]. Hours of Operation:  8:30 am - 5:30 pm, M-F.  Also accepts Medicaid and self-pay.  [facility] [place] [address] [place] Phone: [phone]   [facility] [address] Natasha Bence [place], [place] [phone], Ext. [phone] Mondays & Thursdays: 7-9 AM.  First 15 patients are seen on a first come, first serve basis.    Medicaid-accepting [facility] Providers:  Organization         Address  Phone   Notes  [facility] [address],  [phone]  Also accepts self-pay patients.  [facility] [address], [place]  [phone]   [facility] [address], [place] [phone]) [phone]   [facility] [address] [place] [address], [place] [phone]   Renaye RakersVeita Bland [address], [place]   [phone] Only accepts [place] Access [place] patients after they have their name applied to their card.   Self-Pay (no insurance) in [facility]:  Organization         Address  Phone   Notes  Sickle Cell Patients, [facility] [address], [place] [phone]) [phone]   [facility] [address], [place] [phone]   [facility]  [address], [place] [phone]   [facility]/Dr. Osei-Bonsu  [address] [place] [address], [place] or [phone], [place] [phone] Phone number for both [place] and [place] locations is the same.  [facility] [address], [place] [phone])  [phone]   [facility] [address] [place] [address], [place] or [address] [phone]   [facility] [address], [place] [phone]) [phone], phone; [phone]) [phone], fax Sees patients 1st and 3rd Saturday of every month.  Must not qualify for public or private insurance (i.e. Medicaid, Medicare, [place] Health Choice, Veterans' Benefits)  Household income should be no more than 200% of the poverty level The clinic cannot treat you if you are pregnant or think you are pregnant  Sexually transmitted diseases are not treated at the clinic.    Dental Care: Organization         Address  Phone  Notes  [facility] [address], [place] [phone] Accepts children up to age 521 who are enrolled in [place] or [place] Health Choice; pregnant women with a Medicaid card; and children who have applied for Medicaid or [place] Health Choice, but were declined, whose parents can pay a reduced fee at time of service.  [facility] [place]  [address], [place] [phone] Accepts children up to age 23 who are enrolled in [place] or [place] Health Choice; pregnant women with a Medicaid card; and children who have applied for Medicaid or [place] Health Choice, but were declined, whose parents can pay a reduced fee at time of service.  [facility]  [address], [place] [phone] Patients are seen by appointment only. Walk-ins are not accepted. [facility] will see patients 23 years of age and older. Monday - Tuesday (8am-5pm) Most Wednesdays (8:30-5pm) $30 per visit, cash only  [facility]  [address], [place] [facility] [phone] Patients are seen by appointment only. Walk-ins are not accepted. [facility] will see patients 518 years of age and older. One Wednesday Evening (Monthly: Volunteer  Based).  $30 per visit, cash only  [facility]  [phone]) [phone] for adults; Children under age 684, call Graduate Pediatric Dentistry at [phone]) [phone]. Children aged 834-14, please call [phone] to request a pediatric application.  Dental services are provided in all areas of dental care including fillings, crowns and bridges, complete and partial dentures, implants, gum treatment, root canals, and extractions. Preventive care is also provided. Treatment  is provided to both adults and children. Patients are selected via a lottery and there is often a waiting list.   [facility] [address], [place]  [phone] [email or web address]   [facility] [address] [place], [place] [phone], Ext. [phone] Second and Fourth Thursday of each month, opens at 6:30 AM; Clinic ends at 9 AM.  Patients are seen on a first-come first-served basis, and a limited number are seen during each clinic.   [facility]  [address] Ether Griffins [place], [place] [phone]   Eligibility Requirements You must have lived in [place], [place], or [place] counties for at least the last three months.   You cannot be eligible for state or federal sponsored [place], including [employer], [place], or [employer].   You generally cannot be eligible for healthcare insurance through your employer.    How to apply: Eligibility screenings are held every Tuesday and Wednesday afternoon from 1:00 pm until 4:00 pm. You do not need an appointment for the interview!  [facility] [address], [place], [place] [phone]   [facility] Health Department  [phone]   [facility]  [phone]   [facility] Health Department  [phone]    Behavioral Health Resources in the Community: Intensive Outpatient Programs Organization         Address  Phone  Notes  [facility]  Services [address] [place], [place] [phone]   [facility] [address], [place], [place] [phone]   [facility] [address] [place], [place]  [phone]   [facility] Mental Health [address]  [place], [place] [phone] or [phone]   Substance Abuse Resources Organization         Address  Phone  Notes  [facility]  [phone]   [facility]  [phone]   [facility] [place]  [phone]   Floydene Flock  [phone]   Residential & Outpatient Substance Abuse Program  [phone]   Psychological Services Organization         Address  Phone  Notes  [facility] Health  [phone]   [facility] Services  [phone]   [facility] Mental Health [address] [place] [phone] or [phone]    Mobile Crisis Teams Organization         Address  Phone  Notes  [facility], Mobile Crisis Care Unit  [phone]   [facility]  [address] [place], [place] [phone]   Doristine Locks [address] [place] [phone]    Self-Help/Support Groups Organization         Address  Phone             Notes  [facility] [place] - variety of support groups  [phone]- [phone] Call for more information  Narcotics Anonymous (NA), [facility] [address], [employer] [place]  2 meetings at this location   Statistician         Address  Phone  Notes  [facility] [address],    [place]  [phone]   [facility]  [address], [place] [address], [place], [place] [phone]   [facility] [address] [place], [place] [phone] Admissions: 8am-3pm M-F  [facility] [address]    [place], [place]  [phone]   [facility] [address] Starling Manns [place], [place] [phone]    [facility] [address]  [place], [place] [phone]   [facility] - Intensive Outpatient [address], [place], [place] [phone]   [facility] ([facility]iction [facility] [address] [place]  [place], [place] [phone] or [phone]   [facility] ([facility]) [address], [place], [place] [phone] Accepts Medicaid  [facility] [address],  [place] [phone] Substance Abuse/Addiction Treatment   [facility] Organization         Address  Phone  Notes  [facility]  [phone]   Angie Fava, PhD [address] Ervin Knack [place], [place]   [phone] or [phone]   [facility] Behavioral   [address] [place], [place] [phone]   [facility] [address], [place], [place] [phone] Insurance/Medicaid/sponsorship through [facility] and Families [address]                                    [place], [place] [phone] Therapy/tele-psych/case  [facility] [address], [place] [phone]    Dr. Lolly Mustache  [phone]   [facility]  [employer] [facility] Dept. 1) [address] [place] 2) [address], [place] 3)  [address], [place] [phone]  [phone]   [facility] Child Abuse Hotline 2108103008 or [phone] (After Hours)

## 2015-06-30 IMAGING — US US OB FOLLOW-UP
1 series · 12 of 28 positions shown · non-contrast
Comparison: none

[Series 1: us ob follow-up · 12 of 33 slices shown]
[im 2/33]
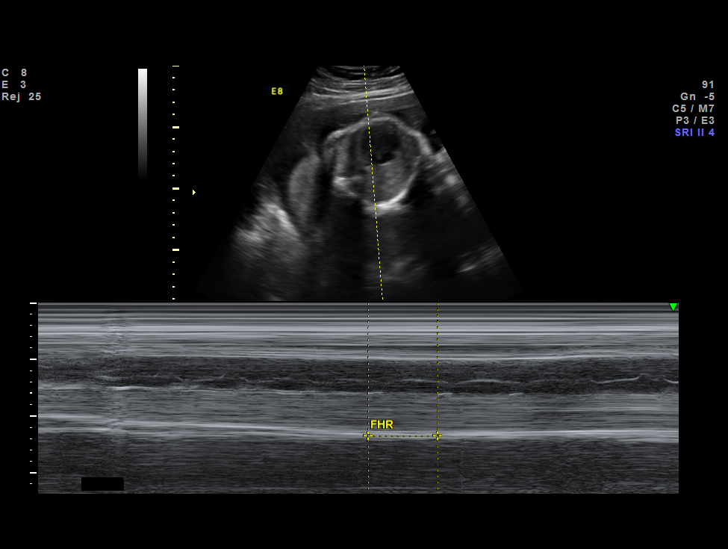
[im 4/33]
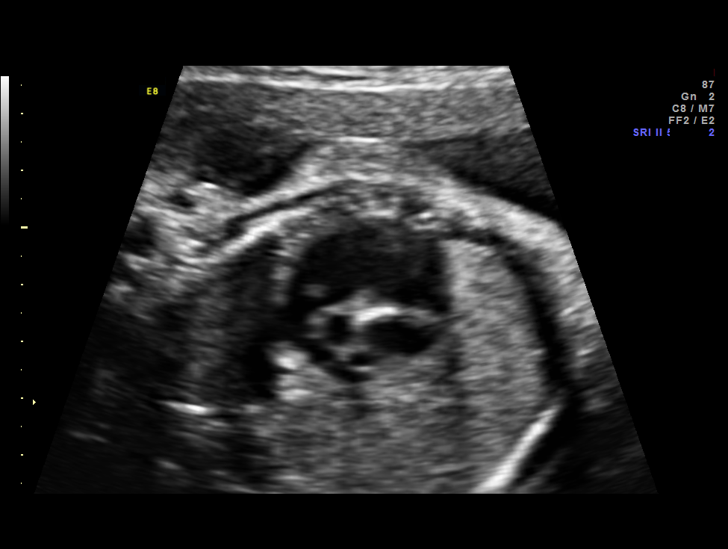
[im 6/33]
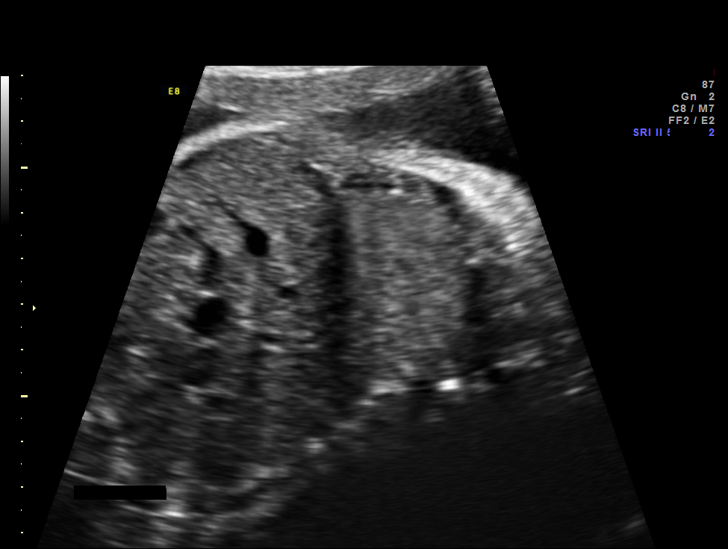
[im 10/33]
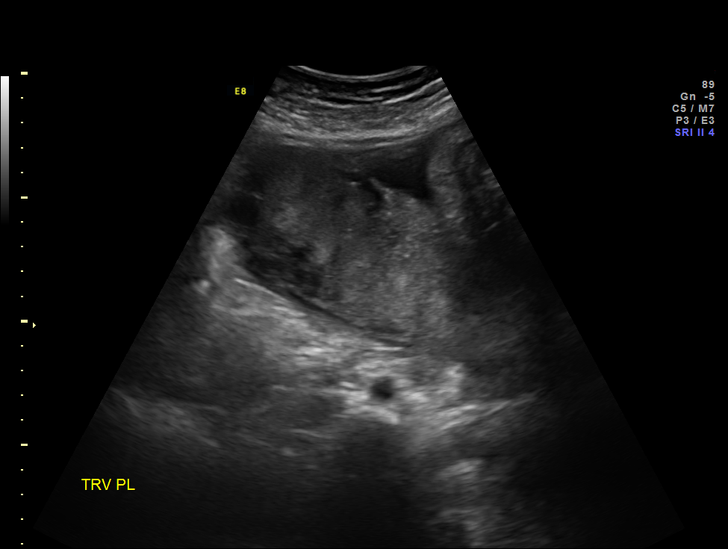
[im 12/33]
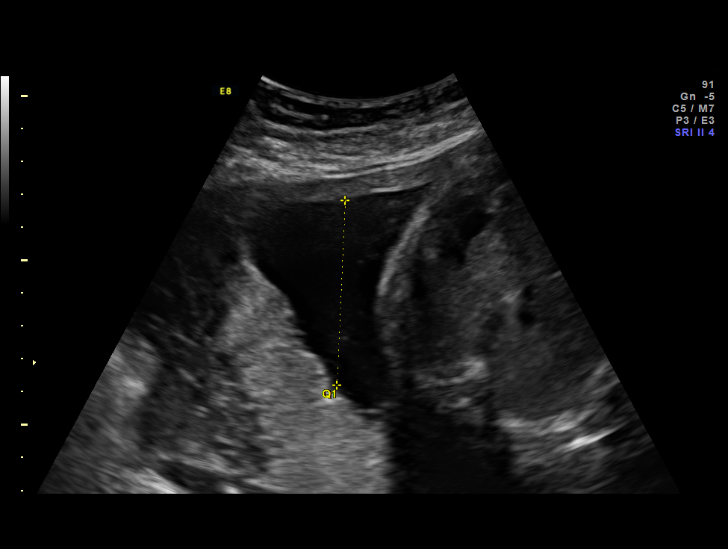
[im 15/33]
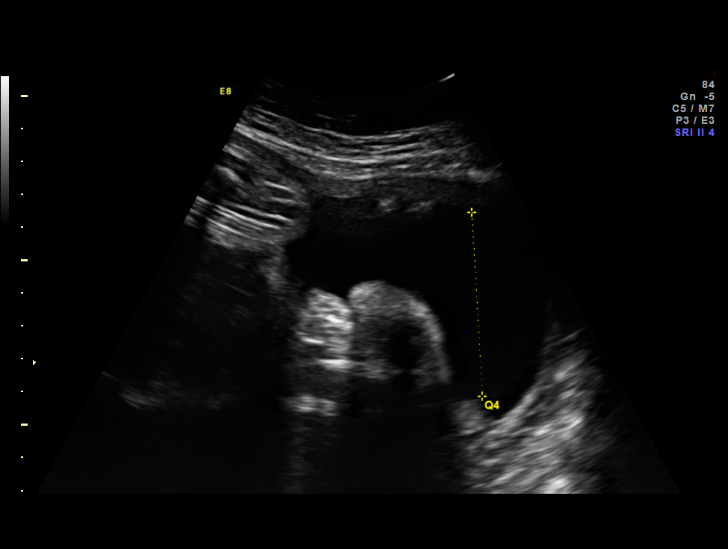
[im 18/33]
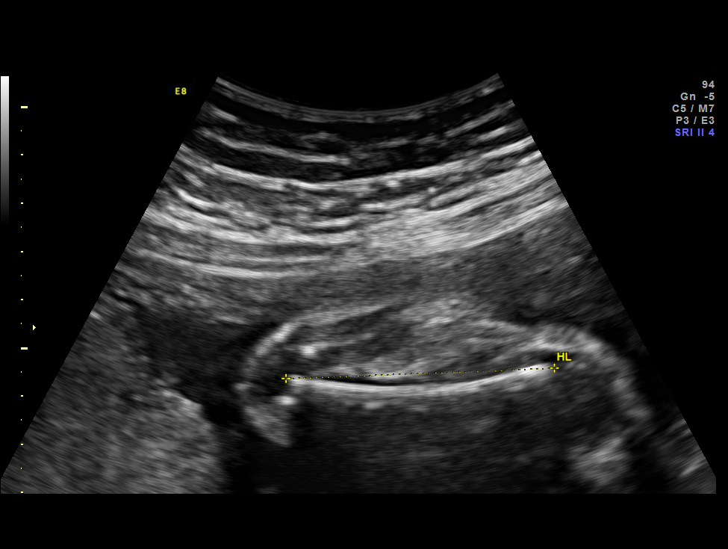
[im 21/33]
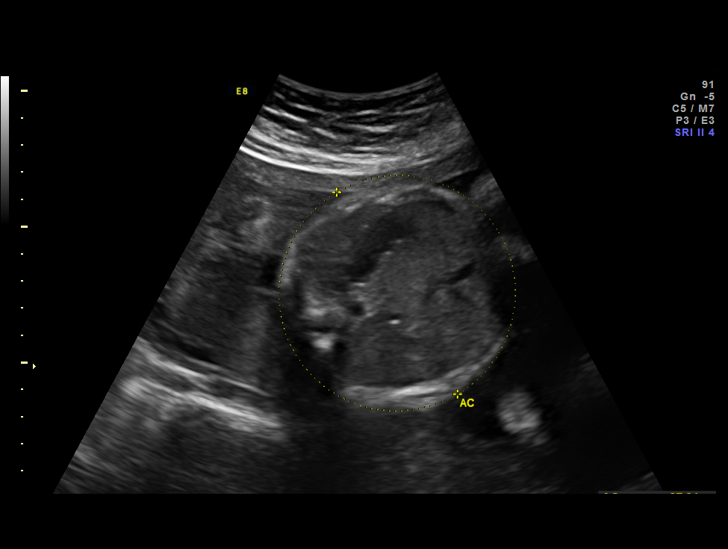
[im 23/33]
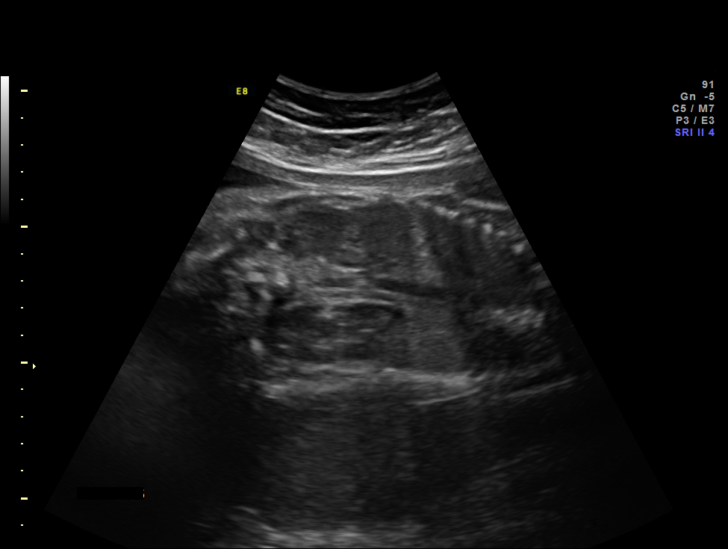
[im 27/33]
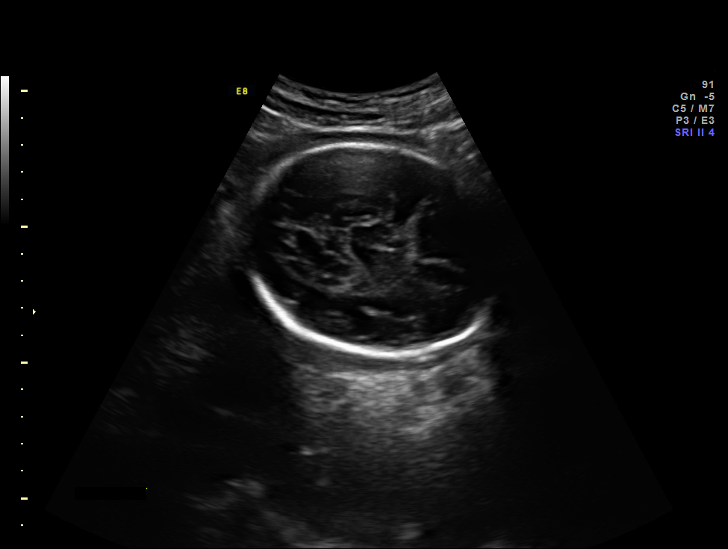
[im 29/33]
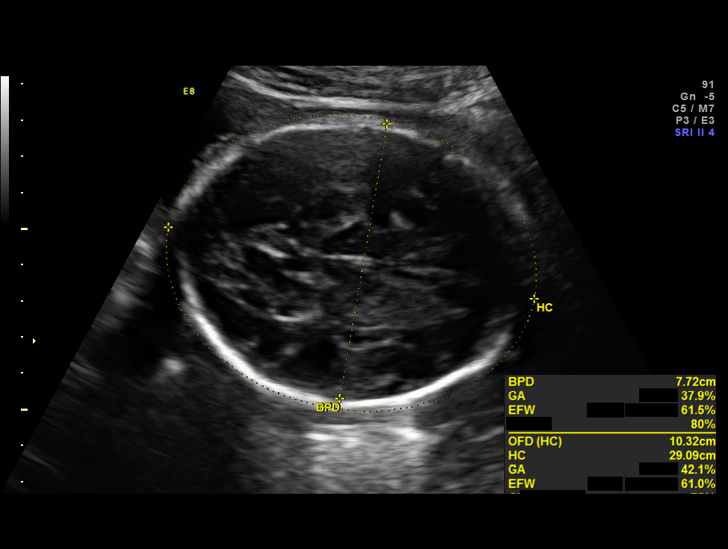
[im 31/33]
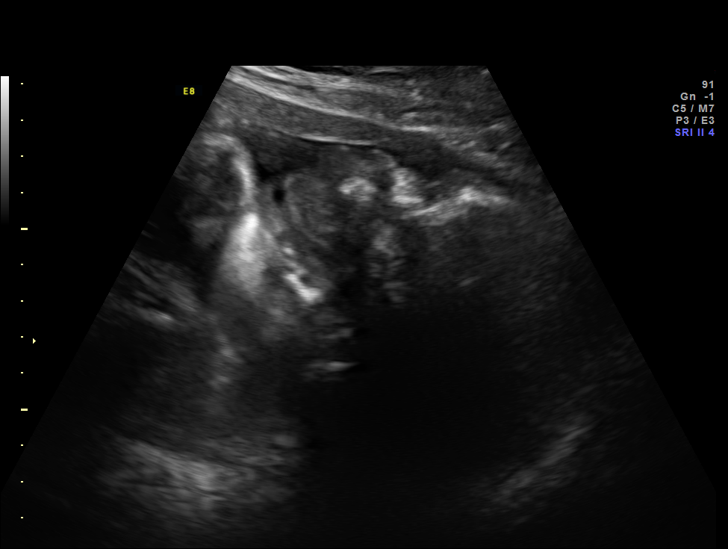

[12 of 28 positions shown; findings below may reference images not displayed]

OBSTETRICS REPORT
                      (Signed Final 06/21/2014 [DATE])

Service(s) Provided

 US OB FOLLOW UP                                       76816.1
Indications

 Poor obstetric history: Previous preterm delivery
 (21 weeks)
 Cervical insufficiency,3rd cerclage 02/15/14
 31 weeks gestation of pregnancy
Fetal Evaluation

 Num Of Fetuses:    1
 Fetal Heart Rate:  145                          bpm
 Cardiac Activity:  Observed
 Presentation:      Cephalic
 Placenta:          Anterior, above cervical os
 P. Cord            Previously Visualized
 Insertion:

 Amniotic Fluid
 AFI FV:      Subjectively within normal limits
 AFI Sum:     20.8    cm       81  %Tile     Larg Pckt:    5.97  cm
 RUQ:   5.62    cm   RLQ:    5.61   cm    LUQ:   3.6     cm   LLQ:    5.97   cm
Biometry

 BPD:     76.9  mm     G. Age:  30w 6d                CI:         74.0   70 - 86
 OFD:    103.9  mm                                    FL/HC:      20.9   19.3 -

 HC:     291.7  mm     G. Age:  32w 1d       46  %    HC/AC:      1.07   0.96 -

 AC:     272.3  mm     G. Age:  31w 2d       56  %    FL/BPD:     79.5   71 - 87
 FL:      61.1  mm     G. Age:  31w 5d       56  %    FL/AC:      22.4   20 - 24
 HUM:     55.7  mm     G. Age:  32w 3d       80  %
 Est. FW:    9660  gm    3 lb 15 oz      65  %
Gestational Age

 LMP:           31w 0d        Date:  11/16/13                 EDD:   08/23/14
 U/S Today:     31w 4d                                        EDD:   08/19/14
 Best:          31w 0d     Det. By:  LMP  (11/16/13)          EDD:   08/23/14
Anatomy
 Cranium:          Previously seen        Aortic Arch:      Previously seen
 Fetal Cavum:      Previously seen        Ductal Arch:      Previously seen
 Ventricles:       Appears normal         Diaphragm:        Appears normal
 Choroid Plexus:   Previously seen        Stomach:          Appears normal, left
                                                            sided
 Cerebellum:       Previously seen        Abdomen:          Previously seen
 Posterior Fossa:  Previously seen        Abdominal Wall:   Previously seen
 Nuchal Fold:      Previously seen        Cord Vessels:     Previously seen
 Face:             Orbits and profile     Kidneys:          Appear normal
                   previously seen
 Lips:             Previously seen        Bladder:          Appears normal
 Heart:            Appears normal         Spine:            Previously seen
                   (4CH, axis, and
                   situs)
 RVOT:             Previously seen        Lower             Previously seen
                                          Extremities:
 LVOT:             Previously seen        Upper             Previously seen
                                          Extremities:

 Other:  Female gender.
Cervix Uterus Adnexa

 Cervix:       Not visualized (advanced GA >65wks)
Impression

 SIUP at 31+0 weeks, history of adverse pregnancy outcome
 Normal interval anatomy; anatomic survey complete
 Normal amniotic fluid volume
 Appropriate interval growth with EFW at the 65th %tile
Recommendations

 Interval growth in 4 weeks

## 2015-07-28 IMAGING — US US OB FOLLOW-UP
1 series · 12 of 28 positions shown · non-contrast
Comparison: none

[Series 1: us ob follow-up · 0.28mm/px · 12 of 34 slices shown]
[im 2/34]
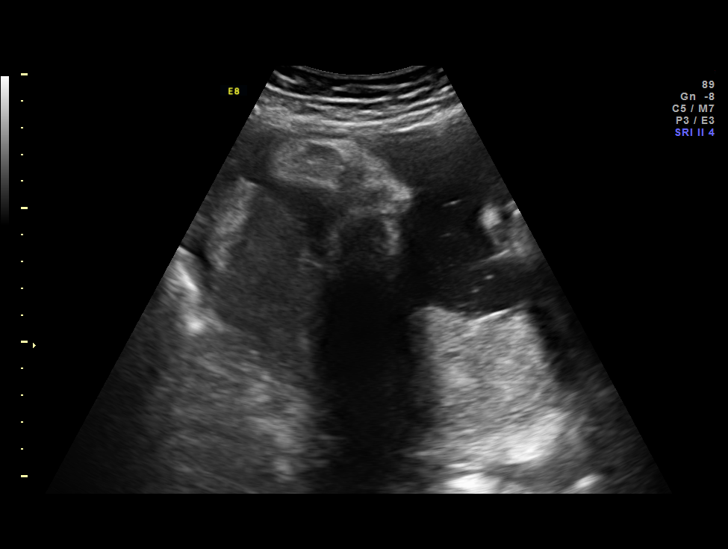
[im 4/34]
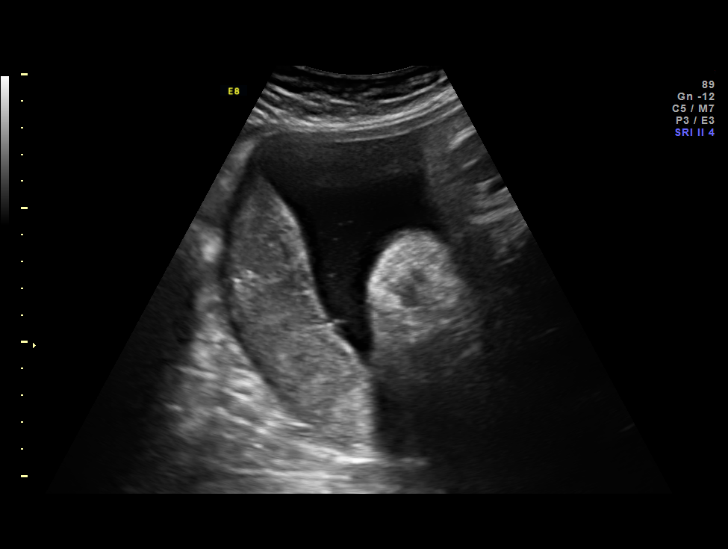
[im 7/34]
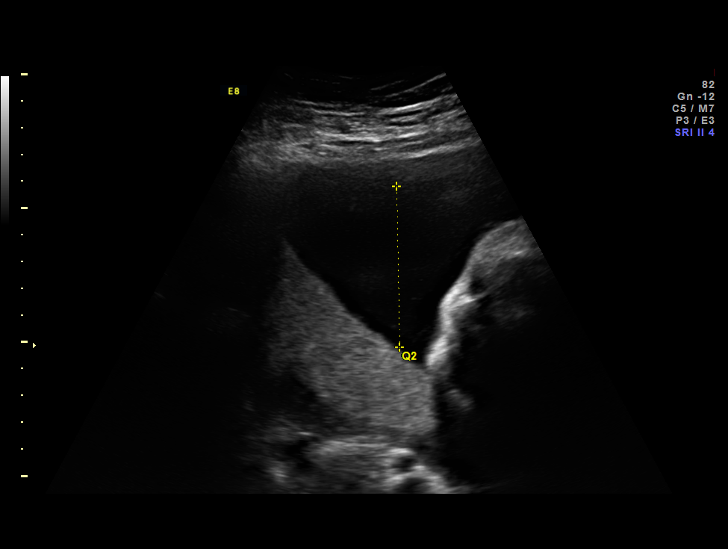
[im 10/34]
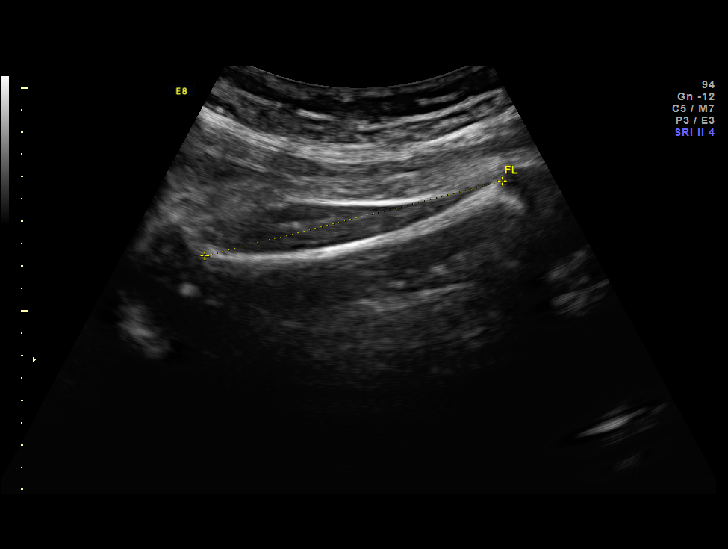
[im 13/34]
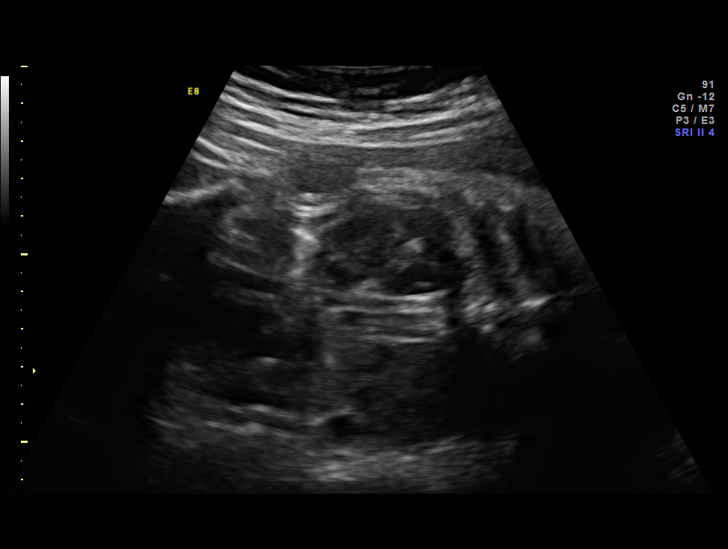
[im 15/34]
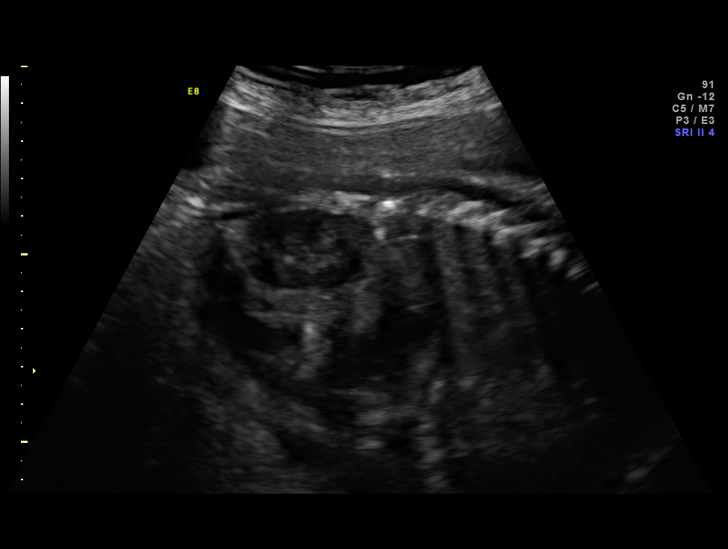
[im 19/34]
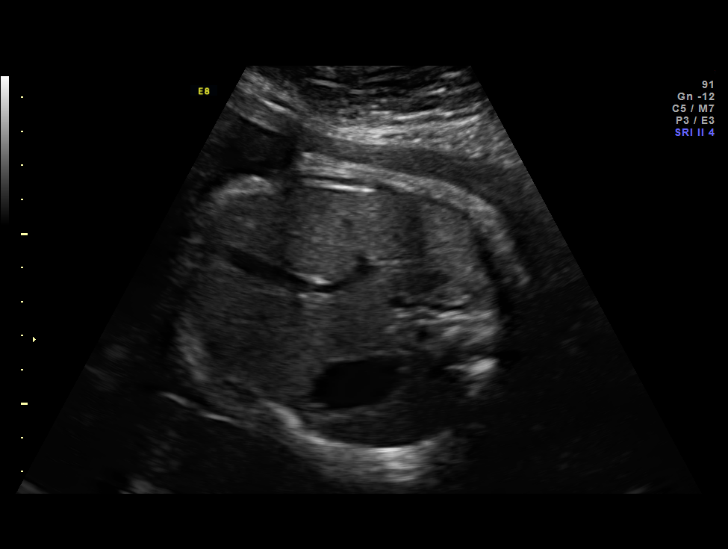
[im 21/34]
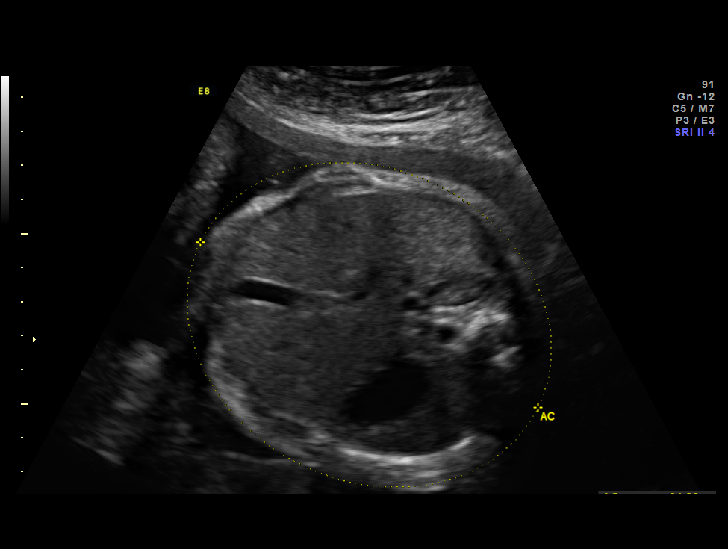
[im 24/34]
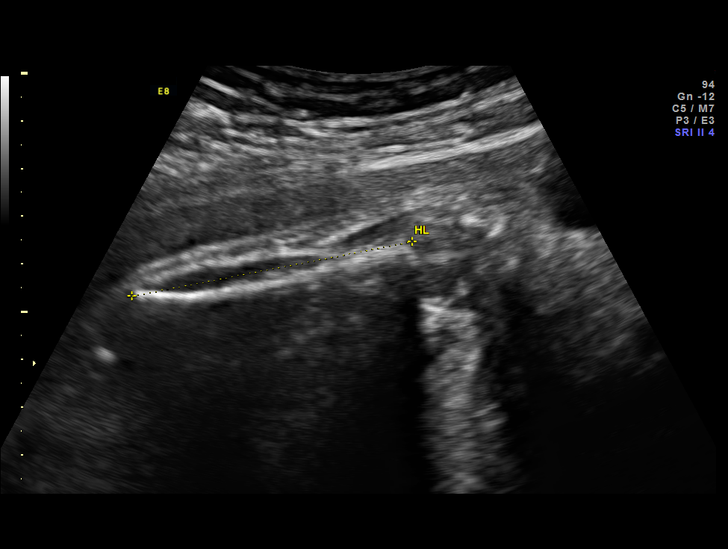
[im 27/34]
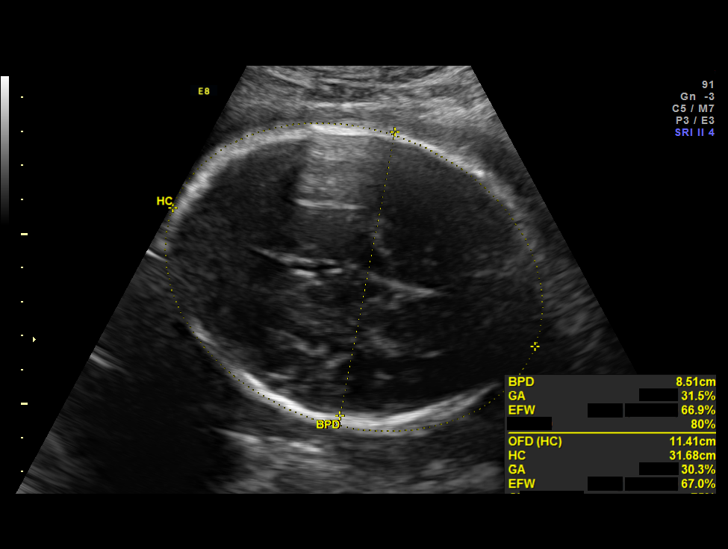
[im 30/34]
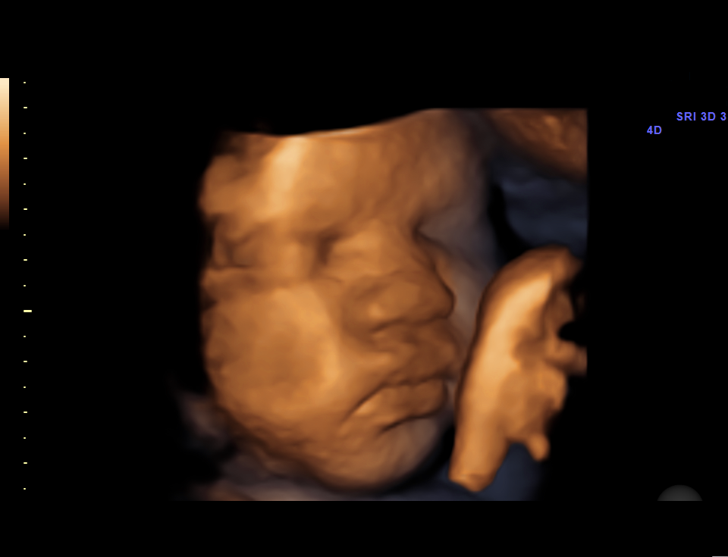
[im 32/34]
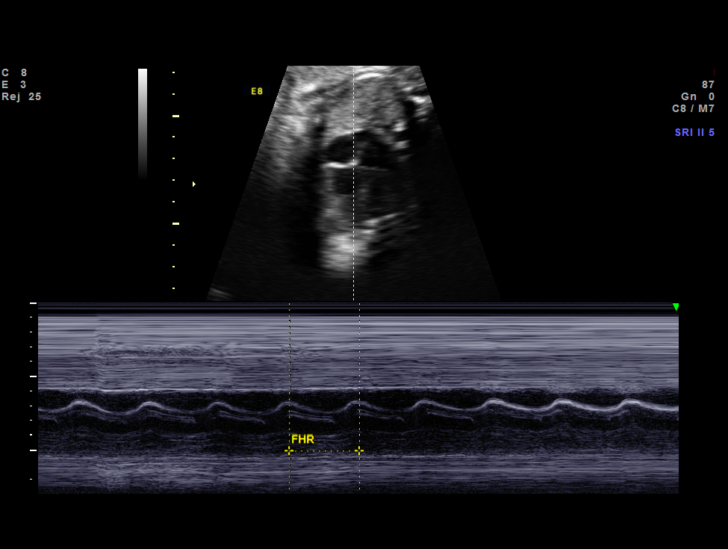

[12 of 28 positions shown; findings below may reference images not displayed]

OBSTETRICS REPORT
                      (Signed Final 07/20/2014 [DATE])

Service(s) Provided

 US OB FOLLOW UP                                       76816.1
Indications

 Poor obstetric history: Previous preterm delivery
 (21 weeks)
 Cervical insufficiency, 3rd trimester; cerclage
 02/15/14
 35 weeks gestation of pregnancy
Fetal Evaluation

 Num Of Fetuses:    1
 Fetal Heart Rate:  144                          bpm
 Cardiac Activity:  Observed
 Presentation:      Cephalic
 Placenta:          Anterior, above cervical os
 P. Cord            Previously Visualized
 Insertion:

 Amniotic Fluid
 AFI FV:      Subjectively within normal limits
 AFI Sum:     22.55    cm      85  %Tile      Larg Pckt:   6.63  cm
 RUQ:   5.41    cm   RLQ:    4.49   cm    LUQ:    6.02   cm    LLQ:   6.63    cm
Biometry

 BPD:     85.2   mm    G. Age:  34w 2d                CI:          75.6   70 - 86
 OFD:    112.7   mm                                   FL/HC:       21.6   20.1 -

 HC:     316.8   mm    G. Age:  35w 4d       31   %   HC/AC:       1.00   0.93 -

 AC:     316.7   mm    G. Age:  35w 4d       73   %   FL/BPD:      80.4   71 - 87
 FL:      68.5   mm    G. Age:  35w 1d       47   %   FL/AC:       21.6   20 - 24
 HUM:     59.9   mm    G. Age:  34w 6d       60   %

 Est. FW:    7696   gm    5 lb 14 oz     69  %
Gestational Age

 LMP:           35w 0d        Date:  11/16/13                 EDD:    08/23/14
 U/S Today:     35w 1d                                        EDD:    08/22/14
 Best:          35w 0d     Det. By:  LMP  (11/16/13)          EDD:    08/23/14
Anatomy

 Cranium:          Previously seen        Aortic Arch:       Previously seen
 Fetal Cavum:      Previously seen        Ductal Arch:       Previously seen
 Ventricles:       Appears normal         Diaphragm:         Appears normal
 Choroid Plexus:   Previously seen        Stomach:           Appears normal, left
                                                             sided
 Cerebellum:       Previously seen        Abdomen:           Previously seen
 Posterior Fossa:  Previously seen        Abdominal Wall:    Previously seen
 Nuchal Fold:      Previously seen        Cord Vessels:      Previously seen
 Face:             Orbits and profile     Kidneys:           Appear normal
                   previously seen
 Lips:             Previously seen        Bladder:           Appears normal
 Heart:            Appears normal         Spine:             Previously seen
                   (4CH, axis, and
                   situs)
 RVOT:             Previously seen        Lower              Previously seen
                                          Extremities:
 LVOT:             Previously seen        Upper              Previously seen
                                          Extremities:

 Other:  Female gender.
Cervix Uterus Adnexa

 Cervix:       Not visualized (advanced GA >87wks)
Impression

 SIUP at 35+0 weeks
 Normal interval anatomy; anatomic survey complete
 Normal amniotic fluid volume
 Appropriate interval growth with EFW at the 69th %tile
Recommendations

 Follow-up as clinically indicated

## 2018-02-25 ENCOUNTER — Emergency Department (HOSPITAL_COMMUNITY): Payer: Medicaid Other

## 2018-02-25 ENCOUNTER — Other Ambulatory Visit: Payer: Self-pay

## 2018-02-25 ENCOUNTER — Emergency Department (HOSPITAL_COMMUNITY)
Admission: EM | Admit: 2018-02-25 | Discharge: 2018-02-25 | Disposition: A | Discharge status: Home / Self Care | Payer: Medicaid Other | Attending: Emergency Medicine | Admitting: Emergency Medicine

## 2018-02-25 ENCOUNTER — Encounter (HOSPITAL_COMMUNITY): Payer: Self-pay | Admitting: Emergency Medicine

## 2018-02-25 DIAGNOSIS — O99280 Endocrine, nutritional and metabolic diseases complicating pregnancy, unspecified trimester: Secondary | ICD-10-CM | POA: Diagnosis not present

## 2018-02-25 DIAGNOSIS — R109 Unspecified abdominal pain: Secondary | ICD-10-CM | POA: Diagnosis not present

## 2018-02-25 DIAGNOSIS — Z3490 Encounter for supervision of normal pregnancy, unspecified, unspecified trimester: Secondary | ICD-10-CM

## 2018-02-25 DIAGNOSIS — R112 Nausea with vomiting, unspecified: Secondary | ICD-10-CM | POA: Diagnosis not present

## 2018-02-25 DIAGNOSIS — E860 Dehydration: Secondary | ICD-10-CM | POA: Diagnosis not present

## 2018-02-25 DIAGNOSIS — O9989 Other specified diseases and conditions complicating pregnancy, childbirth and the puerperium: Secondary | ICD-10-CM | POA: Insufficient documentation

## 2018-02-25 LAB — URINALYSIS, ROUTINE W REFLEX MICROSCOPIC
BILIRUBIN URINE: NEGATIVE
Glucose, UA: NEGATIVE mg/dL
HGB URINE DIPSTICK: NEGATIVE
KETONES UR: 80 mg/dL — AB
NITRITE: NEGATIVE
PH: 6 (ref 5.0–8.0)
Protein, ur: 30 mg/dL — AB
SPECIFIC GRAVITY, URINE: 1.032 — AB (ref 1.005–1.030)

## 2018-02-25 LAB — COMPREHENSIVE METABOLIC PANEL
ALT: 15 U/L (ref 0–44)
AST: 17 U/L (ref 15–41)
Albumin: 4.1 g/dL (ref 3.5–5.0)
Alkaline Phosphatase: 66 U/L (ref 38–126)
Anion gap: 13 (ref 5–15)
BUN: 7 mg/dL (ref 6–20)
CO2: 22 mmol/L (ref 22–32)
CREATININE: 0.67 mg/dL (ref 0.44–1.00)
Calcium: 9.1 mg/dL (ref 8.9–10.3)
Chloride: 102 mmol/L (ref 98–111)
GFR calc Af Amer: >60 mL/min (ref >60)
GFR calc non Af Amer: >60 mL/min (ref >60)
Glucose, Bld: 85 mg/dL (ref 70–99)
Potassium: 3.3 mmol/L — ABNORMAL LOW (ref 3.5–5.1)
Sodium: 137 mmol/L (ref 135–145)
TOTAL PROTEIN: 7.4 g/dL (ref 6.5–8.1)
Total Bilirubin: 1.6 mg/dL — ABNORMAL HIGH (ref 0.3–1.2)

## 2018-02-25 LAB — I-STAT BETA HCG BLOOD, ED (MC, WL, AP ONLY): I-stat hCG, quantitative: >2000 m[IU]/mL — ABNORMAL HIGH (ref <5)

## 2018-02-25 LAB — CBC
HCT: 38.4 % (ref 36.0–46.0)
Hemoglobin: 13.1 g/dL (ref 12.0–15.0)
MCH: 31 pg (ref 26.0–34.0)
MCHC: 34.1 g/dL (ref 30.0–36.0)
MCV: 91 fL (ref 78.0–100.0)
PLATELETS: 272 10*3/uL (ref 150–400)
RBC: 4.22 MIL/uL (ref 3.87–5.11)
RDW: 11.9 % (ref 11.5–15.5)
WBC: 5.5 10*3/uL (ref 4.0–10.5)

## 2018-02-25 LAB — LIPASE, BLOOD: Lipase: 35 U/L (ref 11–51)

## 2018-02-25 MED ORDER — CEPHALEXIN 500 MG PO CAPS: 500.0000 mg | ORAL_CAPSULE | Freq: Three times a day (TID) | ORAL | 0 refills | Status: DC

## 2018-02-25 MED ORDER — ONDANSETRON HCL 4 MG/2ML IJ SOLN
4.0000 mg | Freq: Once | INTRAMUSCULAR | Status: AC
  Administered 2018-02-25: 4 mg via INTRAVENOUS
  Filled 2018-02-25: qty 2

## 2018-02-25 MED ORDER — METOCLOPRAMIDE HCL 10 MG PO TABS: 10.0000 mg | ORAL_TABLET | Freq: Three times a day (TID) | ORAL | 0 refills | Status: DC | PRN

## 2018-02-25 MED ORDER — SODIUM CHLORIDE 0.9 % IV BOLUS
1000.0000 mL | Freq: Once | INTRAVENOUS | Status: AC
  Administered 2018-02-25: 1000 mL via INTRAVENOUS

## 2018-02-25 MED ORDER — CEPHALEXIN 250 MG PO CAPS
500.0000 mg | ORAL_CAPSULE | Freq: Once | ORAL | Status: AC
  Administered 2018-02-25: 500 mg via ORAL
  Filled 2018-02-25: qty 2

## 2018-02-25 NOTE — ED Triage Notes (Addendum)
Pt reports nausea and vomiting x 1 week. Pt reports lower abdominal pain x 1 week. Pt reports poor appetite and intermittent diarrhea

## 2018-02-25 NOTE — ED Provider Notes (Addendum)
[facility] Provider Note   CSN: [id number] Arrival date & time: 02/25/18  0036     History   Chief Complaint Chief Complaint  Patient presents with  . Abdominal Pain  . Emesis    HPI Rhonda Gordon is a 27 y.o. female.  HPI 27 year old G1, P0 presents to the emergency department with complaints of nausea and vomiting over the past week.  Last normal menstrual cycle was at the beginning of July.  She is currently late on this menstrual cycle.  No vaginal bleeding.  Reports some mild lower abdominal discomfort.  Some diarrhea today.  No blood in her stool.  No blood in her vomit.  Symptoms are moderate in severity.  Reports inability to keep food and fluids down over the past several days.  Has not checked a home pregnancy test.   Past Medical History:  Diagnosis Date  . Preterm labor   . SVD (spontaneous vaginal delivery) 08/2013   svd - fetal loss at 27 wks    Patient Active Problem List   Diagnosis Date Noted  . Active labor at term 08/28/2014  . History of preterm delivery   . Maternal care for cervical incompetence in third trimester   . Prior poor obstetrical history in third trimester, antepartum   . Cervical cerclage suture present   . Cervical insufficiency during pregnancy in second trimester, antepartum   . Supervision of high-risk pregnancy 02/01/2014  . Incompetent cervix in pregnancy, antepartum 02/01/2014  . Rh negative state in antepartum period 02/01/2014    Past Surgical History:  Procedure Laterality Date  . CERVICAL CERCLAGE N/A 02/15/2014   Procedure: CERCLAGE CERVICAL;  Surgeon: Catalina AntiguaPeggy Constant, MD;  Location: [facility];  Service: Gynecology;  Laterality: N/A;  . TOOTH EXTRACTION       OB History    Gravida  2   Para  2   Term  1   Preterm  1   AB      Living  1     SAB      TAB      Ectopic      Multiple  0   Live Births  1            Home Medications    Prior to Admission  medications   Medication Sig Start Date End Date Taking? Authorizing Provider  metoCLOPramide (REGLAN) 10 MG tablet Take 1 tablet (10 mg total) by mouth every 8 (eight) hours as needed for nausea or vomiting. 02/25/18   Azalia Bilisampos, Yeraldi Fidler, MD    Family History Family History  Problem Relation Age of Onset  . Hypertension Mother   . Hypertension Sister     Social History Social History   Tobacco Use  . Smoking status: Never Smoker  . Smokeless tobacco: Never Used  Substance Use Topics  . Alcohol use: No  . Drug use: No     Allergies   Patient has no known allergies.   Review of Systems Review of Systems  All other systems reviewed and are negative.    Physical Exam Updated Vital Signs BP (!) 111/55   Pulse 81   Temp 98.4 F (36.9 C) (Oral)   Resp 16   Ht 5\' 6"  (1.676 m)   Wt 68 kg   LMP 01/17/2018   SpO2 93%   BMI 24.21 kg/m   Physical Exam  Constitutional: She is oriented to person, place, and time. She appears well-developed and well-nourished. No distress.  HENT:  Head: Normocephalic and atraumatic.  Eyes: EOM are normal.  Neck: Normal range of motion.  Cardiovascular: Normal rate, regular rhythm and normal heart sounds.  Pulmonary/Chest: Effort normal and breath sounds normal.  Abdominal: Soft. She exhibits no distension. There is no tenderness.  Musculoskeletal: Normal range of motion.  Neurological: She is alert and oriented to person, place, and time.  Skin: Skin is warm and dry.  Psychiatric: She has a normal mood and affect. Judgment normal.  Nursing note and vitals reviewed.    ED Treatments / Results  Labs (all labs ordered are listed, but only abnormal results are displayed) Labs Reviewed  COMPREHENSIVE METABOLIC PANEL - Abnormal; Notable for the following components:      Result Value   Potassium 3.3 (*)    Total Bilirubin 1.6 (*)    All other components within normal limits  URINALYSIS, ROUTINE W REFLEX MICROSCOPIC - Abnormal; Notable  for the following components:   Color, Urine AMBER (*)    APPearance HAZY (*)    Specific Gravity, Urine 1.032 (*)    Ketones, ur 80 (*)    Protein, ur 30 (*)    Leukocytes, UA LARGE (*)    Bacteria, UA FEW (*)    All other components within normal limits  I-STAT BETA HCG BLOOD, ED ([facility], [facility], [facility] ONLY) - Abnormal; Notable for the following components:   I-stat hCG, quantitative >2,000.0 (*)    All other components within normal limits  LIPASE, BLOOD  CBC    EKG None  Radiology US Ob Comp < 14 Wks  Result Date: 02/25/2018 CLINICAL DATA:  Acute onset of generalized abdominal pain. EXAM: OBSTETRIC <14 WK [place] AND TRANSVAGINAL OB US TECHNIQUE: Both transabdominal and transvaginal ultrasound examinations were performed for complete evaluation of the gestation as well as the maternal uterus, adnexal regions, and pelvic cul-de-sac. Transvaginal technique was performed to assess early pregnancy. COMPARISON:  Pelvic ultrasound performed 07/19/2014 FINDINGS: Intrauterine gestational sac: Single; visualized and normal in shape. Yolk sac:  Yes Embryo:  Yes Cardiac Activity: Yes Heart Rate: 104 bpm CRL: 3.6  mm   6 w   0 d                  [place] EDC: 10/21/2018 Subchorionic hemorrhage:  None visualized. Maternal uterus/adnexae: The uterus is otherwise unremarkable in appearance. The ovaries are within normal limits. The right ovary measures 3.4 x 1.6 x 2.4 cm, while the left ovary measures 4.9 x 3.5 x 4.4 cm. No suspicious adnexal masses are seen; there is no evidence for ovarian torsion. A small amount of free fluid is noted within the pelvic cul-de-sac. IMPRESSION: Single live intrauterine pregnancy, with a crown-rump length of 4 mm, corresponding to a gestational age of [redacted] weeks 0 days. This matches the gestational age of [redacted] weeks 4 days by LMP, reflecting an estimated date of delivery of October 24, 2018. Electronically Signed   By: Roanna Raider M.D.   On: 02/25/2018 05:10   US Ob Transvaginal  Result Date:  02/25/2018 CLINICAL DATA:  Acute onset of generalized abdominal pain. EXAM: OBSTETRIC <14 WK [place] AND TRANSVAGINAL OB US TECHNIQUE: Both transabdominal and transvaginal ultrasound examinations were performed for complete evaluation of the gestation as well as the maternal uterus, adnexal regions, and pelvic cul-de-sac. Transvaginal technique was performed to assess early pregnancy. COMPARISON:  Pelvic ultrasound performed 07/19/2014 FINDINGS: Intrauterine gestational sac: Single; visualized and normal in shape. Yolk sac:  Yes Embryo:  Yes Cardiac Activity:  Yes Heart Rate: 104 bpm CRL: 3.6  mm   6 w   0 d                  [place] EDC: 10/21/2018 Subchorionic hemorrhage:  None visualized. Maternal uterus/adnexae: The uterus is otherwise unremarkable in appearance. The ovaries are within normal limits. The right ovary measures 3.4 x 1.6 x 2.4 cm, while the left ovary measures 4.9 x 3.5 x 4.4 cm. No suspicious adnexal masses are seen; there is no evidence for ovarian torsion. A small amount of free fluid is noted within the pelvic cul-de-sac. IMPRESSION: Single live intrauterine pregnancy, with a crown-rump length of 4 mm, corresponding to a gestational age of [redacted] weeks 0 days. This matches the gestational age of [redacted] weeks 4 days by LMP, reflecting an estimated date of delivery of October 24, 2018. Electronically Signed   By: Roanna Raider M.D.   On: 02/25/2018 05:10    Procedures Procedures (including critical care time)  Medications Ordered in ED Medications  sodium chloride 0.9 % bolus 1,000 mL (1,000 mLs Intravenous New Bag/Given 02/25/18 0352)  ondansetron (ZOFRAN) injection 4 mg (4 mg Intravenous Given 02/25/18 0353)     Initial Impression / Assessment and Plan / ED Course  I have reviewed the triage vital signs and the nursing notes.  Pertinent labs & imaging results that were available during my care of the patient were reviewed by me and considered in my medical decision making (see chart for details).      Fluids given.  Nausea improved.  Intrauterine pregnancy noted on ultrasound.  OB follow-up.  Unisom over-the-counter available for nausea.  If this does not help she is been given a prescription for Reglan but I encouraged her to try the over-the-counter option to begin with.  Patient understands return to the ER for new or worsening symptoms  Questionable appearing urine.  Urine culture sent.  Patient be treated presumptively with Keflex for possible UTI.  Final Clinical Impressions(s) / ED Diagnoses   Final diagnoses:  Acute abdominal pain  Intrauterine pregnancy  Nausea and vomiting, intractability of vomiting not specified, unspecified vomiting type  Acute dehydration    ED Discharge Orders         Ordered    metoCLOPramide (REGLAN) 10 MG tablet  Every 8 hours PRN,   Status:  Discontinued     02/25/18 0543    metoCLOPramide (REGLAN) 10 MG tablet  Every 8 hours PRN     02/25/18 0543           Azalia Bilis, MD 02/25/18 1610    Azalia Bilis, MD 02/25/18 [phone]

## 2018-02-25 NOTE — Discharge Instructions (Signed)
Use unisom over the counter for your nausea

## 2018-02-26 LAB — URINE CULTURE: CULTURE: NO GROWTH

## 2018-03-02 ENCOUNTER — Inpatient Hospital Stay (HOSPITAL_COMMUNITY)
Admission: AD | Admit: 2018-03-02 | Discharge: 2018-03-02 | Disposition: A | Discharge status: Home / Self Care | Payer: Medicaid Other | Source: Ambulatory Visit | Attending: Obstetrics and Gynecology | Admitting: Obstetrics and Gynecology

## 2018-03-02 ENCOUNTER — Encounter (HOSPITAL_COMMUNITY): Payer: Self-pay | Admitting: *Deleted

## 2018-03-02 DIAGNOSIS — Z3A01 Less than 8 weeks gestation of pregnancy: Secondary | ICD-10-CM | POA: Insufficient documentation

## 2018-03-02 DIAGNOSIS — O219 Vomiting of pregnancy, unspecified: Secondary | ICD-10-CM

## 2018-03-02 DIAGNOSIS — O210 Mild hyperemesis gravidarum: Secondary | ICD-10-CM | POA: Diagnosis not present

## 2018-03-02 LAB — URINALYSIS, ROUTINE W REFLEX MICROSCOPIC
BACTERIA UA: NONE SEEN
Bilirubin Urine: NEGATIVE
Glucose, UA: NEGATIVE mg/dL
Ketones, ur: 80 mg/dL — AB
NITRITE: NEGATIVE
PROTEIN: 100 mg/dL — AB
SPECIFIC GRAVITY, URINE: 1.031 — AB (ref 1.005–1.030)
pH: 6 (ref 5.0–8.0)

## 2018-03-02 MED ORDER — METOCLOPRAMIDE HCL 5 MG/ML IJ SOLN
10.0000 mg | Freq: Once | INTRAMUSCULAR | Status: AC
  Administered 2018-03-02: 10 mg via INTRAVENOUS
  Filled 2018-03-02: qty 2

## 2018-03-02 MED ORDER — PROMETHAZINE HCL 25 MG PO TABS: 25.0000 mg | ORAL_TABLET | Freq: Every day | ORAL | 0 refills | Status: DC

## 2018-03-02 MED ORDER — M.V.I. ADULT IV INJ
INJECTION | Freq: Once | INTRAVENOUS | Status: AC
  Administered 2018-03-02: 11:00:00 via INTRAVENOUS
  Filled 2018-03-02: qty 10

## 2018-03-02 MED ORDER — LACTATED RINGERS IV BOLUS
1000.0000 mL | Freq: Once | INTRAVENOUS | Status: AC
  Administered 2018-03-02: 1000 mL via INTRAVENOUS

## 2018-03-02 MED ORDER — FAMOTIDINE IN NACL 20-0.9 MG/50ML-% IV SOLN
20.0000 mg | Freq: Once | INTRAVENOUS | Status: AC
  Administered 2018-03-02: 20 mg via INTRAVENOUS
  Filled 2018-03-02: qty 50

## 2018-03-02 NOTE — MAU Provider Note (Signed)
Chief Complaint: Nausea; Emesis; and Abdominal Pain   First Provider Initiated Contact with Patient 03/02/18 0914     SUBJECTIVE HPI: Rhonda Gordon is a 27 y.o. G3P1101 at [redacted]w[redacted]d who presents to Maternity Admissions reporting nausea & vomiting. Reports n/v for 2 weeks. Was seen in ED last week. Diagnosed with UTI & prescribed keflex & reglan. States she stopped taking the keflex d/t n/v.  Reports that the reglan doesn't work. Last time she was able to eat was yesterday afternoon. Some lower abdominal cramping. Denies fever/chills, diarrhea, dysuria, vaginal bleeding, or vaginal discharge. Had IUP confirmed at last ED visit.  Has prenatal care scheduled for next month.    Past Medical History:  Diagnosis Date  . Preterm labor   . SVD (spontaneous vaginal delivery) 08/2013   svd - fetal loss at 27 wks   OB History  Gravida Para Term Preterm AB Living  3 2 1 1   1   SAB TAB Ectopic Multiple Live Births        0 1    # Outcome Date GA Lbr Len/2nd Weight Sex Delivery Anes PTL Lv  3 Current           2 Term 08/28/14 [redacted]w[redacted]d 05:32 / 00:37 3950 g F Vag-Spont EPI  LIV     Birth Comments: Required blow-by oxygen in the delivery room, then placed under oxyhood for low saturations. Weaned to room air after 1.5 hours and transferred to nursery.   1 Preterm 09/15/13 [redacted]w[redacted]d -14:11 / 00:03 374 g M Vag-Spont None  FD   Past Surgical History:  Procedure Laterality Date  . CERVICAL CERCLAGE N/A 02/15/2014   Procedure: CERCLAGE CERVICAL;  Surgeon: Catalina Antigua, MD;  Location: [facility];  Service: Gynecology;  Laterality: N/A;  . TOOTH EXTRACTION     Social History   Socioeconomic History  . Marital status: Single    Spouse name: Not on file  . Number of children: Not on file  . Years of education: Not on file  . Highest education level: Not on file  Occupational History  . Not on file  Social Needs  . Financial resource strain: Not on file  . Food insecurity:    Worry: Not on file    Inability:  Not on file  . Transportation needs:    Medical: Not on file    Non-medical: Not on file  Tobacco Use  . Smoking status: Never Smoker  . Smokeless tobacco: Never Used  Substance and Sexual Activity  . Alcohol use: No  . Drug use: No  . Sexual activity: Not Currently    Birth control/protection: None    Comment: approx [redacted] wks gestation  Lifestyle  . Physical activity:    Days per week: Not on file    Minutes per session: Not on file  . Stress: Not on file  Relationships  . Social connections:    Talks on phone: Not on file    Gets together: Not on file    Attends religious service: Not on file    Active member of club or organization: Not on file    Attends meetings of clubs or organizations: Not on file    Relationship status: Not on file  . Intimate partner violence:    Fear of current or ex partner: Not on file    Emotionally abused: Not on file    Physically abused: Not on file    Forced sexual activity: Not on file  Other Topics Concern  .  Not on file  Social History Narrative  . Not on file   Family History  Problem Relation Age of Onset  . Hypertension Mother   . Hypertension Sister    No current facility-administered medications on file prior to encounter.    Current Outpatient Medications on File Prior to Encounter  Medication Sig Dispense Refill  . cephALEXin (KEFLEX) 500 MG capsule Take 1 capsule (500 mg total) by mouth 3 (three) times daily. 21 capsule 0  . metoCLOPramide (REGLAN) 10 MG tablet Take 1 tablet (10 mg total) by mouth every 8 (eight) hours as needed for nausea or vomiting. 10 tablet 0   No Known Allergies  I have reviewed patient's Past Medical Hx, Surgical Hx, Family Hx, Social Hx, medications and allergies.   Review of Systems  Constitutional: Negative.   Gastrointestinal: Positive for abdominal pain, nausea and vomiting. Negative for constipation and diarrhea.  Genitourinary: Negative.     OBJECTIVE Patient Vitals for the past 24  hrs:  BP Temp Temp src Pulse Resp SpO2 Height Weight  03/02/18 0904 - - - - - - 5\' 6"  (1.676 m) 65.3 kg  03/02/18 0855 114/67 98.1 F (36.7 C) Oral 62 16 98 % - -   Constitutional: Well-developed, well-nourished female in no acute distress.  Cardiovascular: normal rate & rhythm, no murmur Respiratory: normal rate and effort. Lung sounds clear throughout GI: Abd soft, non-tender, Pos BS x 4. No guarding or rebound tenderness MS: Extremities nontender, no edema, normal ROM Neurologic: Alert and oriented x 4.    LAB RESULTS Results for orders placed or performed during the hospital encounter of 03/02/18 (from the past 24 hour(s))  Urinalysis, Routine w reflex microscopic     Status: Abnormal   Collection Time: 03/02/18  9:01 AM  Result Value Ref Range   Color, Urine AMBER (A) YELLOW   APPearance CLOUDY (A) CLEAR   Specific Gravity, Urine 1.031 (H) 1.005 - 1.030   pH 6.0 5.0 - 8.0   Glucose, UA NEGATIVE NEGATIVE mg/dL   Hgb urine dipstick SMALL (A) NEGATIVE   Bilirubin Urine NEGATIVE NEGATIVE   Ketones, ur 80 (A) NEGATIVE mg/dL   Protein, ur 161100 (A) NEGATIVE mg/dL   Nitrite NEGATIVE NEGATIVE   Leukocytes, UA LARGE (A) NEGATIVE   RBC / HPF 21-50 0 - 5 RBC/hpf   WBC, UA 11-20 0 - 5 WBC/hpf   Bacteria, UA NONE SEEN NONE SEEN   Squamous Epithelial / LPF 11-20 0 - 5   Mucus PRESENT     IMAGING No results found.  MAU COURSE Orders Placed This Encounter  Procedures  . Urinalysis, Routine w reflex microscopic  . Discharge patient   Meds ordered this encounter  Medications  . FOLLOWED BY Linked Order Group   . lactated ringers bolus 1,000 mL   . multivitamins adult (MVI -12) 10 mL in dextrose 5% lactated ringers 1,000 mL infusion  . metoCLOPramide (REGLAN) injection 10 mg  . famotidine (PEPCID) IVPB 20 mg premix  . promethazine (PHENERGAN) 25 MG tablet    Sig: Take 1 tablet (25 mg total) by mouth at bedtime.    Dispense:  30 tablet    Refill:  0    Order Specific Question:    Supervising Provider    Answer:   Conan BowensDAVIS, KELLY M [[id number]]    MDM U/a with 80+ ketones. Pt given IV fluids, LR followed by MVI in D5LR, Phenergan & pepcid. Pt reports improvement in symptoms & able to keep down water.  Has not vomiting while in MAU.  Urine culture negative 5 days ago, will d/c keflex.  ASSESSMENT 1. Nausea and vomiting during pregnancy prior to [redacted] weeks gestation     PLAN Discharge home in stable condition. Rx phenergan to take at night. Can take reglan during the day when she has to work or drive  Allergies as of 3/24/40108/14/2019   No Known Allergies     Medication List    STOP taking these medications   cephALEXin 500 MG capsule Commonly known as:  KEFLEX     TAKE these medications   metoCLOPramide 10 MG tablet Commonly known as:  REGLAN Take 1 tablet (10 mg total) by mouth every 8 (eight) hours as needed for nausea or vomiting.   promethazine 25 MG tablet Commonly known as:  PHENERGAN Take 1 tablet (25 mg total) by mouth at bedtime.        Judeth HornLawrence, Kasondra Junod, NP 03/02/2018  1:31 PM

## 2018-03-02 NOTE — MAU Provider Note (Signed)
History     CSN: [id number]  Arrival date and time: 03/02/18 [id number]   First Provider Initiated Contact with Patient 03/02/18 0914      Chief Complaint  Patient presents with  . Nausea  . Emesis  . Abdominal Pain   HPI  Rhonda Gordon is a 883P1101 African-American female with history of preterm labor with fetal loss at 27 weeks complaining of dehydration. She states she has had nausea and vomiting for two weeks and having difficulty keeping food or fluids down. Last time she ate was 1/2 a meal yesterday at noon. She was seen recently for this complaint and was given Reglan PO at home which has not helped. She has been taking Keflex for UTI and has not been keeping the pills down. She also complains of lower abdominal pain. She denies vaginal bleeding, leaking, or uterine contractions. No other complaints. 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  OB History    Gravida  3   Para  2   Term  1   Preterm  1   AB      Living  1     SAB      TAB      Ectopic      Multiple  0   Live Births  1           Past Medical History:  Diagnosis Date  . Preterm labor   . SVD (spontaneous vaginal delivery) 08/2013   svd - fetal loss at 27 wks    Past Surgical History:  Procedure Laterality Date  . CERVICAL CERCLAGE N/A 02/15/2014   Procedure: CERCLAGE CERVICAL;  Surgeon: Catalina AntiguaPeggy Constant, MD;  Location: [facility];  Service: Gynecology;  Laterality: N/A;  . TOOTH EXTRACTION      Family History  Problem Relation Age of Onset  . Hypertension Mother   . Hypertension Sister     Social History   Tobacco Use  . Smoking status: Never Smoker  . Smokeless tobacco: Never Used  Substance Use Topics  . Alcohol use: No  . Drug use: No    Allergies: No Known Allergies  Medications Prior to Admission  Medication Sig Dispense Refill Last Dose  . cephALEXin (KEFLEX) 500 MG capsule Take 1 capsule (500 mg total) by mouth 3 (three) times daily. 21 capsule 0   .  metoCLOPramide (REGLAN) 10 MG tablet Take 1 tablet (10 mg total) by mouth every 8 (eight) hours as needed for nausea or vomiting. 10 tablet 0     Review of Systems  Constitutional: Negative for chills, fever and unexpected weight change.  Respiratory: Negative for cough, chest tightness and shortness of breath.   Cardiovascular: Negative for chest pain, palpitations and leg swelling.  Gastrointestinal: Positive for nausea and vomiting. Negative for abdominal distention and diarrhea.       Lower abdominal pain  Genitourinary: Negative for decreased urine volume, difficulty urinating, vaginal bleeding and vaginal discharge.  Neurological: Negative for dizziness and headaches.   Physical Exam   Blood pressure 114/67, pulse 62, temperature 98.1 F (36.7 C), temperature source Oral, resp. rate 16, height 5\' 6"  (1.676 m), weight 65.3 kg, last menstrual period 01/17/2018, SpO2 98 %, unknown if currently breastfeeding.  Physical Exam  Constitutional: She is oriented to person, place, and time. She appears well-developed. No distress.  HENT:  Head: Normocephalic and atraumatic.  Mouth/Throat: Mucous membranes are dry.  Cardiovascular: Normal rate and regular rhythm.  Respiratory: Effort normal and breath sounds normal.  No respiratory distress.  GI: Soft. Bowel sounds are normal. She exhibits no distension and no mass. There is no tenderness.  Musculoskeletal: Normal range of motion.  Neurological: She is alert and oriented to person, place, and time.  Skin: Skin is warm and dry. She is not diaphoretic.  Psychiatric: She has a normal mood and affect. Her behavior is normal.    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 Continue reglan at home, add phenergan PO.   Assessment and Plan  1. Vomiting in pregnancy  - IV Reglan  - Famotidine  - Continue Reglan, po, 1 tab, q8 hrs PRN   - Prescription for Phenergan, 25mg , po, q6hrs PRN  - educated on sedative effect  and driving restriction 2. Dehydration  - Lactated ringers, 2L  Margarita RanaHarrison D Ciel Yanes 03/02/2018, 9:32 AM

## 2018-03-02 NOTE — Discharge Instructions (Signed)
Morning Sickness °Morning sickness is when you feel sick to your stomach (nauseous) during pregnancy. This nauseous feeling may or may not come with vomiting. It often occurs in the morning but can be a problem any time of day. Morning sickness is most common during the first trimester, but it may continue throughout pregnancy. While morning sickness is unpleasant, it is usually harmless unless you develop severe and continual vomiting (hyperemesis gravidarum). This condition requires more intense treatment. °What are the causes? °The cause of morning sickness is not completely known but seems to be related to normal hormonal changes that occur in pregnancy. °What increases the risk? °You are at greater risk if you: °· Experienced nausea or vomiting before your pregnancy. °· Had morning sickness during a previous pregnancy. °· Are pregnant with more than one baby, such as twins. ° °How is this treated? °Do not use any medicines (prescription, over-the-counter, or herbal) for morning sickness without first talking to your health care provider. Your health care provider may prescribe or recommend: °· Vitamin B6 supplements. °· Anti-nausea medicines. °· The herbal medicine ginger. ° °Follow these instructions at home: °· Only take over-the-counter or prescription medicines as directed by your health care provider. °· Taking multivitamins before getting pregnant can prevent or decrease the severity of morning sickness in most women. °· Eat a piece of dry toast or unsalted crackers before getting out of bed in the morning. °· Eat five or six small meals a day. °· Eat dry and bland foods (rice, baked potato). Foods high in carbohydrates are often helpful. °· Do not drink liquids with your meals. Drink liquids between meals. °· Avoid greasy, fatty, and spicy foods. °· Get someone to cook for you if the smell of any food causes nausea and vomiting. °· If you feel nauseous after taking prenatal vitamins, take the vitamins at  night or with a snack. °· Snack on protein foods (nuts, yogurt, cheese) between meals if you are hungry. °· Eat unsweetened gelatins for desserts. °· Wearing an acupressure wristband (worn for sea sickness) may be helpful. °· Acupuncture may be helpful. °· Do not smoke. °· Get a humidifier to keep the air in your house free of odors. °· Get plenty of fresh air. °Contact a health care provider if: °· Your home remedies are not working, and you need medicine. °· You feel dizzy or lightheaded. °· You are losing weight. °Get help right away if: °· You have persistent and uncontrolled nausea and vomiting. °· You pass out (faint). °This information is not intended to replace advice given to you by your health care provider. Make sure you discuss any questions you have with your health care provider. °Document Released: 08/27/2006 Document Revised: 12/12/2015 Document Reviewed: 12/21/2012 °Elsevier Interactive Patient Education © 2017 [employer] ° °

## 2018-03-02 NOTE — MAU Note (Signed)
Pt reports she has been nauseated for the last 2 weeks, states she is vomiting every time she tries to eat or drink. Lower abd pain off/on

## 2018-03-04 ENCOUNTER — Inpatient Hospital Stay (HOSPITAL_COMMUNITY)
Admission: AD | Admit: 2018-03-04 | Discharge: 2018-03-05 | Disposition: A | Discharge status: Home / Self Care | Payer: Medicaid Other | Source: Ambulatory Visit | Attending: Obstetrics and Gynecology | Admitting: Obstetrics and Gynecology

## 2018-03-04 ENCOUNTER — Encounter (HOSPITAL_COMMUNITY): Payer: Self-pay

## 2018-03-04 ENCOUNTER — Other Ambulatory Visit: Payer: Self-pay

## 2018-03-04 DIAGNOSIS — O219 Vomiting of pregnancy, unspecified: Secondary | ICD-10-CM

## 2018-03-04 DIAGNOSIS — O210 Mild hyperemesis gravidarum: Secondary | ICD-10-CM | POA: Insufficient documentation

## 2018-03-04 DIAGNOSIS — Z3A01 Less than 8 weeks gestation of pregnancy: Secondary | ICD-10-CM | POA: Diagnosis not present

## 2018-03-04 NOTE — MAU Note (Addendum)
Pt. Reports that this is her 3rd visit for N/V. Reports she was given Reglan and phenergan and neither have worked. Reports she vomited 4 times today. Reports only eating 2 pieces of french toast yesterday. Reports drinking nothing today. Reports feeling "Like I am going to pass out". Has lower abd pain rated 9/10.Denies  Vaginal bleeding.

## 2018-03-05 DIAGNOSIS — O219 Vomiting of pregnancy, unspecified: Secondary | ICD-10-CM

## 2018-03-05 DIAGNOSIS — Z3A01 Less than 8 weeks gestation of pregnancy: Secondary | ICD-10-CM

## 2018-03-05 LAB — URINALYSIS, ROUTINE W REFLEX MICROSCOPIC
Bilirubin Urine: NEGATIVE
Glucose, UA: NEGATIVE mg/dL
Ketones, ur: 80 mg/dL — AB
Nitrite: NEGATIVE
PH: 6 (ref 5.0–8.0)
Protein, ur: 100 mg/dL — AB
RBC / HPF: >50 RBC/hpf — ABNORMAL HIGH (ref 0–5)
SPECIFIC GRAVITY, URINE: 1.033 — AB (ref 1.005–1.030)

## 2018-03-05 MED ORDER — PROMETHAZINE HCL 25 MG/ML IJ SOLN
25.0000 mg | Freq: Once | INTRAMUSCULAR | Status: AC
  Administered 2018-03-05: 25 mg via INTRAVENOUS
  Filled 2018-03-05: qty 1

## 2018-03-05 MED ORDER — FAMOTIDINE IN NACL 20-0.9 MG/50ML-% IV SOLN
20.0000 mg | Freq: Once | INTRAVENOUS | Status: AC
  Administered 2018-03-05: 20 mg via INTRAVENOUS
  Filled 2018-03-05: qty 50

## 2018-03-05 MED ORDER — M.V.I. ADULT IV INJ
Freq: Once | INTRAVENOUS | Status: AC
  Administered 2018-03-05: 04:00:00 via INTRAVENOUS
  Filled 2018-03-05: qty 10

## 2018-03-05 MED ORDER — ONDANSETRON 8 MG PO TBDP: 8.0000 mg | ORAL_TABLET | Freq: Three times a day (TID) | ORAL | 1 refills | Status: DC | PRN

## 2018-03-05 MED ORDER — LACTATED RINGERS IV BOLUS
1000.0000 mL | Freq: Once | INTRAVENOUS | Status: AC
  Administered 2018-03-05: 1000 mL via INTRAVENOUS

## 2018-03-05 MED ORDER — RANITIDINE HCL 150 MG PO TABS: 150.0000 mg | ORAL_TABLET | Freq: Two times a day (BID) | ORAL | 0 refills | Status: DC

## 2018-03-05 NOTE — MAU Provider Note (Signed)
History     CSN: [id number]  Arrival date and time: 03/04/18 2317   First Provider Initiated Contact with Patient 03/05/18 0016      Chief Complaint  Patient presents with  . Emesis  . Nausea   HPI Rhonda Gordon is a 27 y.o. G3P1101 at 1844w1d who presents with nausea and vomiting. This is her third visit for the same complaint since 8/9. She was prescribed phenergan and reglan, but reports no relief with the medication. She states she has thrown up 4 times in the last 24 hours and has not had anything to eat or drink. She reports vomiting every time she takes her medication. She denies any pain, vaginal bleeding or discharge. [facility].   OB History    Gravida  3   Para  2   Term  1   Preterm  1   AB      Living  1     SAB      TAB      Ectopic      Multiple  0   Live Births  1           Past Medical History:  Diagnosis Date  . Preterm labor   . SVD (spontaneous vaginal delivery) 08/2013   svd - fetal loss at 27 wks    Past Surgical History:  Procedure Laterality Date  . CERVICAL CERCLAGE N/A 02/15/2014   Procedure: CERCLAGE CERVICAL;  Surgeon: Catalina AntiguaPeggy Constant, MD;  Location: [facility];  Service: Gynecology;  Laterality: N/A;  . TOOTH EXTRACTION      Family History  Problem Relation Age of Onset  . Hypertension Mother   . Hypertension Sister     Social History   Tobacco Use  . Smoking status: Never Smoker  . Smokeless tobacco: Never Used  Substance Use Topics  . Alcohol use: No  . Drug use: No    Allergies: No Known Allergies  Medications Prior to Admission  Medication Sig Dispense Refill Last Dose  . metoCLOPramide (REGLAN) 10 MG tablet Take 1 tablet (10 mg total) by mouth every 8 (eight) hours as needed for nausea or vomiting. 10 tablet 0 Past Week at Unknown time  . promethazine (PHENERGAN) 25 MG tablet Take 1 tablet (25 mg total) by mouth at bedtime. 30 tablet 0 03/04/2018 at 0930    Review of  Systems  Constitutional: Negative.  Negative for fatigue and fever.  HENT: Negative.   Respiratory: Negative.  Negative for shortness of breath.   Cardiovascular: Negative.  Negative for chest pain.  Gastrointestinal: Positive for nausea and vomiting. Negative for abdominal pain, constipation and diarrhea.  Genitourinary: Negative.  Negative for dysuria, vaginal bleeding and vaginal discharge.  Neurological: Negative.  Negative for dizziness and headaches.   Physical Exam   Blood pressure 117/67, pulse 84, temperature 98.3 F (36.8 C), temperature source Oral, resp. rate 18, height 5\' 6"  (1.676 m), weight 64.5 kg, last menstrual period 01/17/2018, SpO2 100 %, unknown if currently breastfeeding.  Physical Exam  Nursing note and vitals reviewed. Constitutional: She is oriented to person, place, and time. She appears well-developed and well-nourished. No distress.  HENT:  Head: Normocephalic.  Eyes: Pupils are equal, round, and reactive to light.  Cardiovascular: Normal rate, regular rhythm and normal heart sounds.  Respiratory: Effort normal and breath sounds normal. No respiratory distress.  GI: Soft. Bowel sounds are normal. She exhibits no distension. There is no tenderness.  Neurological:  She is alert and oriented to person, place, and time.  Skin: Skin is warm and dry.  Psychiatric: She has a normal mood and affect. Her behavior is normal. Judgment and thought content normal.    MAU Course  Procedures Results for orders placed or performed during the hospital encounter of 03/04/18 (from the past 24 hour(s))  Urinalysis, Routine w reflex microscopic     Status: Abnormal   Collection Time: 03/04/18 11:44 PM  Result Value Ref Range   Color, Urine YELLOW YELLOW   APPearance CLOUDY (A) CLEAR   Specific Gravity, Urine 1.033 (H) 1.005 - 1.030   pH 6.0 5.0 - 8.0   Glucose, UA NEGATIVE NEGATIVE mg/dL   Hgb urine dipstick SMALL (A) NEGATIVE   Bilirubin Urine NEGATIVE NEGATIVE    Ketones, ur 80 (A) NEGATIVE mg/dL   Protein, ur 960100 (A) NEGATIVE mg/dL   Nitrite NEGATIVE NEGATIVE   Leukocytes, UA MODERATE (A) NEGATIVE   RBC / HPF >50 (H) 0 - 5 RBC/hpf   WBC, UA 21-50 0 - 5 WBC/hpf   Bacteria, UA RARE (A) NONE SEEN   Squamous Epithelial / LPF 21-50 0 - 5   Mucus PRESENT    MDM UA LR bolus Phenergan IV Pepcid IV Multivitamin in LR bolus  Lengthy discussion with patient about taking medications on a schedule as prescribed. Reminded patient that the goal is not complete resolution of nausea, but to make it more bearable. Food choices reviewed.   Assessment and Plan   1. Nausea and vomiting during pregnancy prior to [redacted] weeks gestation   2. [redacted] weeks gestation of pregnancy    -Discharge home in stable condition -Rx for zofran and zantac given to patient -[facility] -Patient may return to MAU as needed or if her condition were to change or worsen   Rolm BookbinderCaroline M Abu Heavin CNM 03/05/2018, 12:16 AM

## 2018-03-05 NOTE — Discharge Instructions (Signed)
Morning Sickness °Morning sickness is when you feel sick to your stomach (nauseous) during pregnancy. This nauseous feeling may or may not come with vomiting. It often occurs in the morning but can be a problem any time of day. Morning sickness is most common during the first trimester, but it may continue throughout pregnancy. While morning sickness is unpleasant, it is usually harmless unless you develop severe and continual vomiting (hyperemesis gravidarum). This condition requires more intense treatment. °What are the causes? °The cause of morning sickness is not completely known but seems to be related to normal hormonal changes that occur in pregnancy. °What increases the risk? °You are at greater risk if you: °· Experienced nausea or vomiting before your pregnancy. °· Had morning sickness during a previous pregnancy. °· Are pregnant with more than one baby, such as twins. ° °How is this treated? °Do not use any medicines (prescription, over-the-counter, or herbal) for morning sickness without first talking to your health care provider. Your health care provider may prescribe or recommend: °· Vitamin B6 supplements. °· Anti-nausea medicines. °· The herbal medicine ginger. ° °Follow these instructions at home: °· Only take over-the-counter or prescription medicines as directed by your health care provider. °· Taking multivitamins before getting pregnant can prevent or decrease the severity of morning sickness in most women. °· Eat a piece of dry toast or unsalted crackers before getting out of bed in the morning. °· Eat five or six small meals a day. °· Eat dry and bland foods (rice, baked potato). Foods high in carbohydrates are often helpful. °· Do not drink liquids with your meals. Drink liquids between meals. °· Avoid greasy, fatty, and spicy foods. °· Get someone to cook for you if the smell of any food causes nausea and vomiting. °· If you feel nauseous after taking prenatal vitamins, take the vitamins at  night or with a snack. °· Snack on protein foods (nuts, yogurt, cheese) between meals if you are hungry. °· Eat unsweetened gelatins for desserts. °· Wearing an acupressure wristband (worn for sea sickness) may be helpful. °· Acupuncture may be helpful. °· Do not smoke. °· Get a humidifier to keep the air in your house free of odors. °· Get plenty of fresh air. °Contact a health care provider if: °· Your home remedies are not working, and you need medicine. °· You feel dizzy or lightheaded. °· You are losing weight. °Get help right away if: °· You have persistent and uncontrolled nausea and vomiting. °· You pass out (faint). °This information is not intended to replace advice given to you by your health care provider. Make sure you discuss any questions you have with your health care provider. °Document Released: 08/27/2006 Document Revised: 12/12/2015 Document Reviewed: 12/21/2012 °Elsevier Interactive Patient Education © 2017 [employer] ° °

## 2018-03-06 LAB — CULTURE, OB URINE

## 2018-03-07 ENCOUNTER — Inpatient Hospital Stay (HOSPITAL_COMMUNITY)
Admission: AD | Admit: 2018-03-07 | Discharge: 2018-03-08 | Disposition: A | Discharge status: Home / Self Care | Payer: Medicaid Other | Source: Ambulatory Visit | Attending: Obstetrics & Gynecology | Admitting: Obstetrics & Gynecology

## 2018-03-07 DIAGNOSIS — O219 Vomiting of pregnancy, unspecified: Secondary | ICD-10-CM

## 2018-03-07 DIAGNOSIS — Z3A01 Less than 8 weeks gestation of pregnancy: Secondary | ICD-10-CM | POA: Insufficient documentation

## 2018-03-07 DIAGNOSIS — O210 Mild hyperemesis gravidarum: Secondary | ICD-10-CM | POA: Insufficient documentation

## 2018-03-07 NOTE — MAU Note (Signed)
Pt here for nausea and vomiting. States she has not eaten in days. Reports that her stomach hurts. Pt states she was given nausea medication but nothing helps. States she last took her medication yesterday because it makes her throw up. States she has zofran, reglan, and phenergan. States she also has the suppositories but it made her constipated. Pt states she has vomited 7 times today. States she vomits everything she takes by mouth.

## 2018-03-08 ENCOUNTER — Encounter (HOSPITAL_COMMUNITY): Payer: Self-pay | Admitting: *Deleted

## 2018-03-08 DIAGNOSIS — Z3A01 Less than 8 weeks gestation of pregnancy: Secondary | ICD-10-CM | POA: Diagnosis not present

## 2018-03-08 DIAGNOSIS — O219 Vomiting of pregnancy, unspecified: Secondary | ICD-10-CM

## 2018-03-08 DIAGNOSIS — O210 Mild hyperemesis gravidarum: Secondary | ICD-10-CM | POA: Diagnosis not present

## 2018-03-08 LAB — URINALYSIS, ROUTINE W REFLEX MICROSCOPIC
Bacteria, UA: NONE SEEN
Bilirubin Urine: NEGATIVE
Glucose, UA: NEGATIVE mg/dL
Ketones, ur: 80 mg/dL — AB
Nitrite: NEGATIVE
Protein, ur: 100 mg/dL — AB
Specific Gravity, Urine: 1.029 (ref 1.005–1.030)
pH: 6 (ref 5.0–8.0)

## 2018-03-08 MED ORDER — FAMOTIDINE IN NACL 20-0.9 MG/50ML-% IV SOLN
20.0000 mg | Freq: Once | INTRAVENOUS | Status: AC
  Administered 2018-03-08: 20 mg via INTRAVENOUS
  Filled 2018-03-08: qty 50

## 2018-03-08 MED ORDER — SCOPOLAMINE 1 MG/3DAYS TD PT72
1.0000 | MEDICATED_PATCH | TRANSDERMAL | Status: DC
  Administered 2018-03-08: 1.5 mg via TRANSDERMAL
  Filled 2018-03-08: qty 1

## 2018-03-08 MED ORDER — PROCHLORPERAZINE EDISYLATE 10 MG/2ML IJ SOLN
10.0000 mg | Freq: Once | INTRAMUSCULAR | Status: AC
  Administered 2018-03-08: 10 mg via INTRAVENOUS
  Filled 2018-03-08: qty 2

## 2018-03-08 MED ORDER — LACTATED RINGERS IV BOLUS
1000.0000 mL | Freq: Once | INTRAVENOUS | Status: AC
  Administered 2018-03-08: 1000 mL via INTRAVENOUS

## 2018-03-08 MED ORDER — SCOPOLAMINE 1 MG/3DAYS TD PT72: 1.0000 | MEDICATED_PATCH | TRANSDERMAL | 1 refills | Status: DC

## 2018-03-08 MED ORDER — DEXTROSE IN LACTATED RINGERS 5 % IV SOLN
Freq: Once | INTRAVENOUS | Status: AC
  Administered 2018-03-08: 02:00:00 via INTRAVENOUS
  Filled 2018-03-08: qty 10

## 2018-03-08 MED ORDER — PROCHLORPERAZINE 25 MG RE SUPP: 25.0000 mg | Freq: Two times a day (BID) | RECTAL | 1 refills | Status: DC | PRN

## 2018-03-08 NOTE — MAU Provider Note (Signed)
Chief Complaint: Nausea and Emesis   First Provider Initiated Contact with Patient 03/08/18 0039      SUBJECTIVE HPI: Rhonda Gordon is a 27 y.o. G3P1101 at [redacted]w[redacted]d by LMP who presents to maternity admissions reporting nausea and vomiting. She reports N/V that has been occurring since getting pregnant. She reports being unable to keep anything down including water- has been seen multiple times in the MAU for same complaint. She has taken her zofran, phenergan, zantac and reglan with no relief. She reports vomiting after taking her medication. She reports vomiting 13+ times over the past 24 hours. She states she has been vomiting so much her upper abdomen hurts. She denies lower abdominal pain or cramping. She denies vaginal bleeding, vaginal itching/burning, urinary symptoms.  Past Medical History:  Diagnosis Date  . Preterm delivery   . Preterm labor   . SVD (spontaneous vaginal delivery) 08/2013   svd - fetal loss at 27 wks   Past Surgical History:  Procedure Laterality Date  . CERVICAL CERCLAGE N/A 02/15/2014   Procedure: CERCLAGE CERVICAL;  Surgeon: Catalina Antigua, MD;  Location: [facility];  Service: Gynecology;  Laterality: N/A;  . TOOTH EXTRACTION     Social History   Socioeconomic History  . Marital status: Single    Spouse name: Not on file  . Number of children: Not on file  . Years of education: Not on file  . Highest education level: Not on file  Occupational History  . Not on file  Social Needs  . Financial resource strain: Not on file  . Food insecurity:    Worry: Not on file    Inability: Not on file  . Transportation needs:    Medical: Not on file    Non-medical: Not on file  Tobacco Use  . Smoking status: Never Smoker  . Smokeless tobacco: Never Used  Substance and Sexual Activity  . Alcohol use: No  . Drug use: No  . Sexual activity: Yes    Birth control/protection: None    Comment: 3 weeks ago  Lifestyle  . Physical activity:    Days per week: Not on file     Minutes per session: Not on file  . Stress: Not on file  Relationships  . Social connections:    Talks on phone: Not on file    Gets together: Not on file    Attends religious service: Not on file    Active member of club or organization: Not on file    Attends meetings of clubs or organizations: Not on file    Relationship status: Not on file  . Intimate partner violence:    Fear of current or ex partner: Not on file    Emotionally abused: Not on file    Physically abused: Not on file    Forced sexual activity: Not on file  Other Topics Concern  . Not on file  Social History Narrative  . Not on file   No current facility-administered medications on file prior to encounter.    Current Outpatient Medications on File Prior to Encounter  Medication Sig Dispense Refill  . metoCLOPramide (REGLAN) 10 MG tablet Take 1 tablet (10 mg total) by mouth every 8 (eight) hours as needed for nausea or vomiting. 10 tablet 0  . ondansetron (ZOFRAN ODT) 8 MG disintegrating tablet Take 1 tablet (8 mg total) by mouth every 8 (eight) hours as needed for nausea or vomiting. 30 tablet 1  . promethazine (PHENERGAN) 25 MG tablet Take 1  tablet (25 mg total) by mouth at bedtime. 30 tablet 0  . ranitidine (ZANTAC) 150 MG tablet Take 1 tablet (150 mg total) by mouth 2 (two) times daily. 60 tablet 0   No Known Allergies  ROS:  Review of Systems  Constitutional: Negative.   Respiratory: Negative.   Cardiovascular: Negative.   Gastrointestinal: Positive for abdominal pain, nausea and vomiting. Negative for constipation and diarrhea.  Genitourinary: Negative.   Neurological: Negative.    I have reviewed patient's Past Medical Hx, Surgical Hx, Family Hx, Social Hx, medications and allergies.   Physical Exam   Vitals:   03/07/18 2325 03/08/18 0311  BP: 114/88 (!) 110/55  Pulse: 99 81  Resp: 16   Temp: 98.2 F (36.8 C)   TempSrc: Oral   SpO2: 97%   Weight: 62.6 kg   Height: 5\' 6"  (1.676 m)     Constitutional: Well-developed, well-nourished female in no acute distress.  Cardiovascular: normal rate Respiratory: normal effort GI: Abd soft, tender in epigastric region. Pos BS x 4 MS: Extremities nontender, no edema, normal ROM Neurologic: Alert and oriented x 4.  GU: Neg CVAT. PELVIC EXAM: deferred  LAB RESULTS Results for orders placed or performed during the hospital encounter of 03/07/18 (from the past 24 hour(s))  Urinalysis, Routine w reflex microscopic     Status: Abnormal   Collection Time: 03/08/18 12:00 AM  Result Value Ref Range   Color, Urine YELLOW YELLOW   APPearance HAZY (A) CLEAR   Specific Gravity, Urine 1.029 1.005 - 1.030   pH 6.0 5.0 - 8.0   Glucose, UA NEGATIVE NEGATIVE mg/dL   Hgb urine dipstick SMALL (A) NEGATIVE   Bilirubin Urine NEGATIVE NEGATIVE   Ketones, ur 80 (A) NEGATIVE mg/dL   Protein, ur 811100 (A) NEGATIVE mg/dL   Nitrite NEGATIVE NEGATIVE   Leukocytes, UA MODERATE (A) NEGATIVE   RBC / HPF 11-20 0 - 5 RBC/hpf   WBC, UA 11-20 0 - 5 WBC/hpf   Bacteria, UA NONE SEEN NONE SEEN   Squamous Epithelial / LPF 0-5 0 - 5   Mucus PRESENT    MAU Management/MDM: Orders Placed This Encounter  Procedures  . Urinalysis, Routine w reflex microscopic  . Insert peripheral IV  . Discharge patient Discharge disposition: 01-Home or Self Care; Discharge patient date: 03/08/2018   UA- shows 80 ketones and 100 protein   Meds ordered this encounter  Medications  . lactated ringers bolus 1,000 mL  . multivitamins adult (MVI -12) 10 mL in dextrose 5% lactated ringers 1,000 mL infusion  . famotidine (PEPCID) IVPB 20 mg premix  . prochlorperazine (COMPAZINE) injection 10 mg  . scopolamine (TRANSDERM-SCOP) 1 MG/3DAYS 1.5 mg  . prochlorperazine (COMPAZINE) 25 MG suppository    Sig: Place 1 suppository (25 mg total) rectally every 12 (twelve) hours as needed for nausea or vomiting.    Dispense:  12 suppository    Refill:  1    Order Specific Question:    Supervising Provider    Answer:   Adam PhenixARNOLD, JAMES G [[id number]]  . scopolamine (TRANSDERM-SCOP) 1 MG/3DAYS    Sig: Place 1 patch (1.5 mg total) onto the skin every 3 (three) days.    Dispense:  10 patch    Refill:  1    Order Specific Question:   Supervising Provider    Answer:   Adam PhenixARNOLD, JAMES G [[id number]]   IV LR bolus  Pepcid IV  Banana bag Compazine IM Scope patch   Patient able to tolerate food  and liquid prior to discharge home. Discharge home. Pt stable at time of discharge.   Rx for Compazine suppository and scope patch sent to pharmacy of choice   ASSESSMENT 1. Nausea and vomiting during pregnancy   2. [redacted] weeks gestation of pregnancy     PLAN Discharge home Follow up as scheduled for initial prenatal appointment  Return to MAU as needed  Rx for Compazine and scope patch   Follow-up Information    [facility].   Specialty:  Obstetrics and Gynecology Why:  Follow up with initial prenatal appointment as scheduled  Contact information: [address] [place] [id number] [phone]          Allergies as of 03/08/2018   No Known Allergies     Medication List    STOP taking these medications   metoCLOPramide 10 MG tablet Commonly known as:  REGLAN   promethazine 25 MG tablet Commonly known as:  PHENERGAN     TAKE these medications   ondansetron 8 MG disintegrating tablet Commonly known as:  ZOFRAN-ODT Take 1 tablet (8 mg total) by mouth every 8 (eight) hours as needed for nausea or vomiting.   prochlorperazine 25 MG suppository Commonly known as:  COMPAZINE Place 1 suppository (25 mg total) rectally every 12 (twelve) hours as needed for nausea or vomiting.   ranitidine 150 MG tablet Commonly known as:  ZANTAC Take 1 tablet (150 mg total) by mouth 2 (two) times daily.   scopolamine 1 MG/3DAYS Commonly known as:  TRANSDERM-SCOP Place 1 patch (1.5 mg total) onto the skin every 3 (three) days. Start taking  on:  03/11/2018       Rhonda Gordon  Certified Nurse-Midwife 03/08/2018  3:11 AM

## 2018-03-23 ENCOUNTER — Ambulatory Visit (INDEPENDENT_AMBULATORY_CARE_PROVIDER_SITE_OTHER): Payer: Medicaid Other | Admitting: Obstetrics and Gynecology

## 2018-03-23 ENCOUNTER — Ambulatory Visit: Payer: Self-pay | Admitting: Clinical

## 2018-03-23 ENCOUNTER — Other Ambulatory Visit (HOSPITAL_COMMUNITY)
Admission: RE | Admit: 2018-03-23 | Discharge: 2018-03-23 | Disposition: A | Discharge status: Home / Self Care | Payer: Medicaid Other | Source: Ambulatory Visit | Attending: Obstetrics and Gynecology | Admitting: Obstetrics and Gynecology

## 2018-03-23 ENCOUNTER — Encounter: Payer: Self-pay | Admitting: Obstetrics and Gynecology

## 2018-03-23 ENCOUNTER — Encounter (HOSPITAL_COMMUNITY): Payer: Self-pay

## 2018-03-23 VITALS — Wt 143.8 lb

## 2018-03-23 DIAGNOSIS — O09211 Supervision of pregnancy with history of pre-term labor, first trimester: Secondary | ICD-10-CM

## 2018-03-23 DIAGNOSIS — O09299 Supervision of pregnancy with other poor reproductive or obstetric history, unspecified trimester: Secondary | ICD-10-CM

## 2018-03-23 DIAGNOSIS — A7489 Other chlamydial diseases: Secondary | ICD-10-CM | POA: Insufficient documentation

## 2018-03-23 DIAGNOSIS — B9689 Other specified bacterial agents as the cause of diseases classified elsewhere: Secondary | ICD-10-CM | POA: Diagnosis not present

## 2018-03-23 DIAGNOSIS — O09291 Supervision of pregnancy with other poor reproductive or obstetric history, first trimester: Secondary | ICD-10-CM

## 2018-03-23 DIAGNOSIS — Z3A09 9 weeks gestation of pregnancy: Secondary | ICD-10-CM | POA: Diagnosis not present

## 2018-03-23 DIAGNOSIS — O0991 Supervision of high risk pregnancy, unspecified, first trimester: Secondary | ICD-10-CM

## 2018-03-23 DIAGNOSIS — Z8751 Personal history of pre-term labor: Secondary | ICD-10-CM

## 2018-03-23 DIAGNOSIS — A5901 Trichomonal vulvovaginitis: Secondary | ICD-10-CM | POA: Diagnosis not present

## 2018-03-23 DIAGNOSIS — Z6791 Unspecified blood type, Rh negative: Secondary | ICD-10-CM

## 2018-03-23 DIAGNOSIS — O26891 Other specified pregnancy related conditions, first trimester: Secondary | ICD-10-CM

## 2018-03-23 DIAGNOSIS — O26899 Other specified pregnancy related conditions, unspecified trimester: Secondary | ICD-10-CM | POA: Insufficient documentation

## 2018-03-23 NOTE — Progress Notes (Signed)
Subjective:  Rhonda Gordon is a 27 y.o. G3P1101 at [redacted]w[redacted]d being seen today for first OB visit. EDD by first trimester U/S. Uncertain LMP. Pt with history of incompetent cervix with second trimester loss. Cercalge with last pregnancy 3 yrs ago. TSVD without problems. She is currently monitored for the following issues for this high-risk pregnancy and has History of preterm delivery; Supervision of high risk pregnancy, antepartum, first trimester; History of incompetent cervix, currently pregnant; and Rh negative status during pregnancy on their problem list.  Patient reports no complaints.  Contractions: Not present. Vag. Bleeding: None.  Movement: Absent. Denies leaking of fluid.   The following portions of the patient's history were reviewed and updated as appropriate: allergies, current medications, past family history, past medical history, past social history, past surgical history and problem list. Problem list updated.  Objective:   Vitals:   03/23/18 1401  Weight: 143 lb 12.8 oz (65.2 kg)    Fetal Status: Fetal Heart Rate (bpm): 172   Movement: Absent     General:  Alert, oriented and cooperative. Patient is in no acute distress.  Skin: Skin is warm and dry. No rash noted.   Cardiovascular: Normal heart rate noted  Respiratory: Normal respiratory effort, no problems with respiration noted  Abdomen: Soft, gravid, appropriate for gestational age. Pain/Pressure: Present     Pelvic:  Cervical exam performed        Extremities: Normal range of motion.  Edema: None  Mental Status: Normal mood and affect. Normal behavior. Normal judgment and thought content.   Urinalysis:      Assessment and Plan:  Pregnancy: G3P1101 at [redacted]w[redacted]d  1. Supervision of high risk pregnancy, antepartum, first trimester Prenatal care and labs reviewed - [facility] AMB BABYSCRIPTS OPT IN - Cystic fibrosis gene test - Hemoglobinopathy Evaluation - Obstetric Panel, Including HIV - SMN1 COPY NUMBER ANALYSIS (SMA  Carrier Screen) - [place] MFM OB DETAIL +14 WK; Future - Cytology - PAP  2. History of preterm delivery Secondary to # 3.  3. History of incompetent cervix, currently pregnant Cerclage placement recommended and reviewed with pt. Will scheduled for placement at 12-13 weeks  4. Rh negative status during pregnancy in first trimester Will need Rhogam at 28 weeks  Preterm labor symptoms and general obstetric precautions including but not limited to vaginal bleeding, contractions, leaking of fluid and fetal movement were reviewed in detail with the patient. Please refer to After Visit Summary for other counseling recommendations.  No follow-ups on file.   Hermina Staggers, MD

## 2018-03-23 NOTE — BH Specialist Note (Signed)
Integrated Behavioral Health Initial Visit  MRN: [id number] Name: Rhonda Gordon  Number of Integrated Behavioral Health Clinician visits:: 1/6 Session Start time: 2:41  Session End time: 2:48 Total time: 7 minutes  Type of Service: Integrated Behavioral Health- Individual/Family Interpretor:No. Interpretor Name and Language: n/a   Warm Hand Off Completed.       SUBJECTIVE: Rhonda Gordon is a 26 y.o. female accompanied by n/a Patient was referred by Nettie Elm, MD for Initial OB introduction to integrated behavioral health services . Patient reports the following symptoms/concerns: Pt states no particular concerns today.  Duration of problem: N/A; Severity of problem: n/a  OBJECTIVE: Mood: Normal and Affect: Appropriate Risk of harm to self or others: No plan to harm self or others  LIFE CONTEXT: Family and Social: Pt has 5yo daughter School/Work: - Self-Care: - Life Changes: Current pregnancy   GOALS ADDRESSED:n/a  INTERVENTIONS: Standardized Assessments completed: GAD-7 and PHQ 9  ASSESSMENT: Patient currently experiencing Supervision of high risk pregnancy, antepartum .   Patient may benefit from Initial OB introduction to integrated behavioral health services .  PLAN: 1. Follow up with behavioral health clinician on : As needed 2. Behavioral recommendations:  -Continue taking prenatal vitamin, as recommended by medical provider 3. Referral(s): Integrated Behavioral Health Services (In Clinic) 4. "From scale of 1-10, how likely are you to follow plan?": 10  Rae Lips, LCSW  Depression screen [facility] 2/9 03/23/2018 05/24/2014 05/10/2014 04/19/2014  Decreased Interest 1 0 0 0  Down, Depressed, Hopeless 0 0 0 0  PHQ - 2 Score 1 0 0 0  Altered sleeping 1 - - -  Tired, decreased energy 1 - - -  Change in appetite 1 - - -  Feeling bad or failure about yourself  0 - - -  Trouble concentrating 0 - - -  Moving slowly or fidgety/restless 0 - - -   Suicidal thoughts 0 - - -  PHQ-9 Score 4 - - -   GAD 7 : Generalized Anxiety Score 03/23/2018  Nervous, Anxious, on Edge 0  Control/stop worrying 0  Worry too much - different things 0  Trouble relaxing 0  Restless 0  Easily annoyed or irritable 0  Afraid - awful might happen 0  Total GAD 7 Score 0

## 2018-03-23 NOTE — Patient Instructions (Signed)
Cervical Cerclage Cervical cerclage is a surgical procedure to correct a cervix that opens up and thins out before pregnancy is at term (cervical insufficiency, also called incompetent cervix). This condition can cause labor to start early (prematurely). This procedure involves using stitches to sew the cervix shut during pregnancy. Your surgeon may use ultrasound equipment to help guide the procedure and monitor your baby. Ultrasound equipment uses sound waves to take images of your cervix and uterus. Your surgeon will assess these images on a monitor in the operating room. Tell a health care provider about:  Any allergies you have, especially any allergies related to prescribed medicine, stitches, or anesthetic medicines.  All medicines you are taking, including vitamins, herbs, eye drops, creams, and over-the-counter medicines. Bring a list of all of your medicines to your appointment.  Your medical history, including prior labor deliveries.  Any problems you or family members have had with anesthetic medicines.  Any blood disorders you have.  Any surgeries you have had, including prior cervical stitching.  Any medical conditions you have.  Whether you are pregnant or may be pregnant. What are the risks? Generally, this is a safe procedure. However, problems may occur, including:  Infection, such as infection of the cervix or amniotic sac.  Vaginal bleeding.  Allergic reactions to medicines.  Damage to other structures or organs, such as tearing (rupture) of membranes or cervical laceration.  Premature contractions including going into early labor and delivery.  Cervical dystocia, which occurs when the cervix is unable to dilate normally during labor.  What happens before the procedure? Staying hydrated Follow instructions from your health care provider about hydration, which may include:  Up to 2 hours before the procedure - you may continue to drink clear liquids, such as  water, clear fruit juice, black coffee, and plain tea.  Eating and drinking restrictions Follow instructions from your health care provider about eating and drinking, which may include:  8 hours before the procedure - stop eating heavy meals or foods such as meat, fried foods, or fatty foods.  6 hours before the procedure - stop eating light meals or foods, such as toast or cereal.  6 hours before the procedure - stop drinking milk or drinks that contain milk.  2 hours before the procedure - stop drinking clear liquids.  Medicines  Ask your health care provider about: ? Changing or stopping your regular medicines. This is especially important if you are taking diabetes medicines or blood thinners. ? Taking medicines such as aspirin and ibuprofen. These medicines can thin your blood. Do not take these medicines before your procedure if your health care provider instructs you not to.  You may be given antibiotic medicine to help prevent infection. General instructions  Do not put on any lotion, deodorant, or perfume.  Remove contact lenses and jewelry.  Ask your health care provider how your surgical site will be marked or identified.  You may have an exam or testing.  You may have a blood or urine sample taken.  Plan to have someone take you home from the hospital or clinic.  If you will be going home right after the procedure, plan to have someone with you for 24 hours. What happens during the procedure?  To reduce your risk of infection: ? Your health care team will wash or sanitize their hands. ? Your skin will be washed with soap.  An IV tube will be inserted into one of your veins.  You may be given   one or more of the following: ? A medicine to help you relax (sedative). ? A medicine to numb the area (local anesthetic). ? A medicine to make you fall asleep (general anesthetic). ? A medicine that is injected into your spine to numb the area below and slightly above  the injection site (spinal anesthetic). ? A medicine that is injected into an area of your body to numb everything below the injection site (regional anesthetic).  A lubricated instrument (speculum) will be inserted into your vagina. The speculum will be widened to open the walls of your vagina so your surgeon can see your cervix.  Your cervix will be grasped and tightly stitched closed (sutured). To do this, your surgeon will stitch a strong band of thread around your cervix, then the thread will be tightened to hold your cervix shut. The procedure may vary among health care providers and hospitals. What happens after the procedure?  Your blood pressure, heart rate, breathing rate, and blood oxygen level will be monitored until the medicines you were given have worn off. You will be monitored for premature contractions.  You may have light bleeding and mild cramping.  You may have to wear compression stockings. These stockings help to prevent blood clots and reduce swelling in your legs.  Do not drive for 24 hours if you received a sedative.  You may be put on bed rest.  You may be given medicine to prevent infection.  You may be given an injection of a hormone (progesterone) to prevent your uterus from tightening (contracting). Summary  Cervical cerclage is a surgical procedure that involves using stitches to sew the cervix shut during pregnancy.  Your blood pressure, heart rate, breathing rate, and blood oxygen level will be monitored until the medicines you were given have worn off. You will be monitored for premature contractions.  You may need to be on bed rest after the procedure.  Plan to have someone take you home from the hospital or clinic. This information is not intended to replace advice given to you by your health care provider. Make sure you discuss any questions you have with your health care provider. Document Released: 06/18/2008 Document Revised: 02/28/2016  Document Reviewed: 02/20/2016 Elsevier Interactive Patient Education  2018 [employer]. First Trimester of Pregnancy The first trimester of pregnancy is from week 1 until the end of week 13 (months 1 through 3). A week after a sperm fertilizes an egg, the egg will implant on the wall of the uterus. This embryo will begin to develop into a baby. Genes from you and your partner will form the baby. The female genes will determine whether the baby will be a boy or a girl. At 6-8 weeks, the eyes and face will be formed, and the heartbeat can be seen on ultrasound. At the end of 12 weeks, all the baby's organs will be formed. Now that you are pregnant, you will want to do everything you can to have a healthy baby. Two of the most important things are to get good prenatal care and to follow your health care provider's instructions. Prenatal care is all the medical care you receive before the baby's birth. This care will help prevent, find, and treat any problems during the pregnancy and childbirth. Body changes during your first trimester Your body goes through many changes during pregnancy. The changes vary from woman to woman.  You may gain or lose a couple of pounds at first.  You may feel sick to  your stomach (nauseous) and you may throw up (vomit). If the vomiting is uncontrollable, call your health care provider.  You may tire easily.  You may develop headaches that can be relieved by medicines. All medicines should be approved by your health care provider.  You may urinate more often. Painful urination may mean you have a bladder infection.  You may develop heartburn as a result of your pregnancy.  You may develop constipation because certain hormones are causing the muscles that push stool through your intestines to slow down.  You may develop hemorrhoids or swollen veins (varicose veins).  Your breasts may begin to grow larger and become tender. Your nipples may stick out more, and the  tissue that surrounds them (areola) may become darker.  Your gums may bleed and may be sensitive to brushing and flossing.  Dark spots or blotches (chloasma, mask of pregnancy) may develop on your face. This will likely fade after the baby is born.  Your menstrual periods will stop.  You may have a loss of appetite.  You may develop cravings for certain kinds of food.  You may have changes in your emotions from day to day, such as being excited to be pregnant or being concerned that something may go wrong with the pregnancy and baby.  You may have more vivid and strange dreams.  You may have changes in your hair. These can include thickening of your hair, rapid growth, and changes in texture. Some women also have hair loss during or after pregnancy, or hair that feels dry or thin. Your hair will most likely return to normal after your baby is born.  What to expect at prenatal visits During a routine prenatal visit:  You will be weighed to make sure you and the baby are growing normally.  Your blood pressure will be taken.  Your abdomen will be measured to track your baby's growth.  The fetal heartbeat will be listened to between weeks 10 and 14 of your pregnancy.  Test results from any previous visits will be discussed.  Your health care provider may ask you:  How you are feeling.  If you are feeling the baby move.  If you have had any abnormal symptoms, such as leaking fluid, bleeding, severe headaches, or abdominal cramping.  If you are using any tobacco products, including cigarettes, chewing tobacco, and electronic cigarettes.  If you have any questions.  Other tests that may be performed during your first trimester include:  Blood tests to find your blood type and to check for the presence of any previous infections. The tests will also be used to check for low iron levels (anemia) and protein on red blood cells (Rh antibodies). Depending on your risk factors, or if  you previously had diabetes during pregnancy, you may have tests to check for high blood sugar that affects pregnant women (gestational diabetes).  Urine tests to check for infections, diabetes, or protein in the urine.  An ultrasound to confirm the proper growth and development of the baby.  Fetal screens for spinal cord problems (spina bifida) and Down syndrome.  HIV (human immunodeficiency virus) testing. Routine prenatal testing includes screening for HIV, unless you choose not to have this test.  You may need other tests to make sure you and the baby are doing well.  Follow these instructions at home: Medicines  Follow your health care provider's instructions regarding medicine use. Specific medicines may be either safe or unsafe to take during pregnancy.  Take  a prenatal vitamin that contains at least 600 micrograms (mcg) of folic acid.  If you develop constipation, try taking a stool softener if your health care provider approves. Eating and drinking  Eat a balanced diet that includes fresh fruits and vegetables, whole grains, good sources of protein such as meat, eggs, or tofu, and low-fat dairy. Your health care provider will help you determine the amount of weight gain that is right for you.  Avoid raw meat and uncooked cheese. These carry germs that can cause birth defects in the baby.  Eating four or five small meals rather than three large meals a day may help relieve nausea and vomiting. If you start to feel nauseous, eating a few soda crackers can be helpful. Drinking liquids between meals, instead of during meals, also seems to help ease nausea and vomiting.  Limit foods that are high in fat and processed sugars, such as fried and sweet foods.  To prevent constipation: ? Eat foods that are high in fiber, such as fresh fruits and vegetables, whole grains, and beans. ? Drink enough fluid to keep your urine clear or pale yellow. Activity  Exercise only as directed by  your health care provider. Most women can continue their usual exercise routine during pregnancy. Try to exercise for 30 minutes at least 5 days a week. Exercising will help you: ? Control your weight. ? Stay in shape. ? Be prepared for labor and delivery.  Experiencing pain or cramping in the lower abdomen or lower back is a good sign that you should stop exercising. Check with your health care provider before continuing with normal exercises.  Try to avoid standing for long periods of time. Move your legs often if you must stand in one place for a long time.  Avoid heavy lifting.  Wear low-heeled shoes and practice good posture.  You may continue to have sex unless your health care provider tells you not to. Relieving pain and discomfort  Wear a good support bra to relieve breast tenderness.  Take warm sitz baths to soothe any pain or discomfort caused by hemorrhoids. Use hemorrhoid cream if your health care provider approves.  Rest with your legs elevated if you have leg cramps or low back pain.  If you develop varicose veins in your legs, wear support hose. Elevate your feet for 15 minutes, 3-4 times a day. Limit salt in your diet. Prenatal care  Schedule your prenatal visits by the twelfth week of pregnancy. They are usually scheduled monthly at first, then more often in the last 2 months before delivery.  Write down your questions. Take them to your prenatal visits.  Keep all your prenatal visits as told by your health care provider. This is important. Safety  Wear your seat belt at all times when driving.  Make a list of emergency phone numbers, including numbers for family, friends, the hospital, and police and fire departments. General instructions  Ask your health care provider for a referral to a local prenatal education class. Begin classes no later than the beginning of month 6 of your pregnancy.  Ask for help if you have counseling or nutritional needs during  pregnancy. Your health care provider can offer advice or refer you to specialists for help with various needs.  Do not use hot tubs, steam rooms, or saunas.  Do not douche or use tampons or scented sanitary pads.  Do not cross your legs for long periods of time.  Avoid cat litter boxes and soil  used by cats. These carry germs that can cause birth defects in the baby and possibly loss of the fetus by miscarriage or stillbirth.  Avoid all smoking, herbs, alcohol, and medicines not prescribed by your health care provider. Chemicals in these products affect the formation and growth of the baby.  Do not use any products that contain nicotine or tobacco, such as cigarettes and e-cigarettes. If you need help quitting, ask your health care provider. You may receive counseling support and other resources to help you quit.  Schedule a dentist appointment. At home, brush your teeth with a soft toothbrush and be gentle when you floss. Contact a health care provider if:  You have dizziness.  You have mild pelvic cramps, pelvic pressure, or nagging pain in the abdominal area.  You have persistent nausea, vomiting, or diarrhea.  You have a bad smelling vaginal discharge.  You have pain when you urinate.  You notice increased swelling in your face, hands, legs, or ankles.  You are exposed to fifth disease or chickenpox.  You are exposed to [place] measles (rubella) and have never had it. Get help right away if:  You have a fever.  You are leaking fluid from your vagina.  You have spotting or bleeding from your vagina.  You have severe abdominal cramping or pain.  You have rapid weight gain or loss.  You vomit blood or material that looks like coffee grounds.  You develop a severe headache.  You have shortness of breath.  You have any kind of trauma, such as from a fall or a car accident. Summary  The first trimester of pregnancy is from week 1 until the end of week 13 (months 1  through 3).  Your body goes through many changes during pregnancy. The changes vary from woman to woman.  You will have routine prenatal visits. During those visits, your health care provider will examine you, discuss any test results you may have, and talk with you about how you are feeling. This information is not intended to replace advice given to you by your health care provider. Make sure you discuss any questions you have with your health care provider. Document Released: 06/30/2001 Document Revised: 06/17/2016 Document Reviewed: 06/17/2016 Elsevier Interactive Patient Education  [employer].

## 2018-03-24 LAB — CYTOLOGY - PAP
Bacterial vaginitis: POSITIVE — AB
CHLAMYDIA, DNA PROBE: POSITIVE — AB
Candida vaginitis: NEGATIVE
DIAGNOSIS: NEGATIVE
Neisseria Gonorrhea: NEGATIVE
TRICH (WINDOWPATH): POSITIVE — AB

## 2018-03-25 ENCOUNTER — Other Ambulatory Visit: Payer: Self-pay

## 2018-03-25 ENCOUNTER — Telehealth: Payer: Self-pay

## 2018-03-25 DIAGNOSIS — Z202 Contact with and (suspected) exposure to infections with a predominantly sexual mode of transmission: Secondary | ICD-10-CM

## 2018-03-25 MED ORDER — AZITHROMYCIN 250 MG PO TABS: 1000.0000 mg | ORAL_TABLET | Freq: Once | ORAL | 0 refills | Status: AC

## 2018-03-25 MED ORDER — METRONIDAZOLE 500 MG PO TABS: 500.0000 mg | ORAL_TABLET | Freq: Two times a day (BID) | ORAL | 0 refills | Status: DC

## 2018-03-25 NOTE — Telephone Encounter (Signed)
Please let pt know that she has BV, trich and chlamydia Flagyl 500 mg po bid x 7 days for BV and trich Azithromycin 2 gm po x 1 for chlamydia, has been sent to pharmacy. Advise to have partner seen and treated Reframe form IC until TOC TOC with next OB visit. No answer, left VM for pt to call back for results.

## 2018-03-25 NOTE — Telephone Encounter (Signed)
Pt called into office and left message on nurse voicemail line stating she was returning our phone call. Called pt and informed her of all results and medications sent to the pharmacy.  Discussed partner treatment and abstaining from intercourse 2 weeks following the last person treated and TOC will be at next OB visit.  Pt verbalized understanding and had no questions.

## 2018-03-25 NOTE — Progress Notes (Unsigned)
STD Card completed 

## 2018-03-27 ENCOUNTER — Other Ambulatory Visit: Payer: Self-pay | Admitting: Obstetrics and Gynecology

## 2018-03-29 ENCOUNTER — Encounter: Payer: Self-pay | Admitting: *Deleted

## 2018-03-31 LAB — OBSTETRIC PANEL, INCLUDING HIV
ANTIBODY SCREEN: NEGATIVE
BASOS: 0 %
Basophils Absolute: 0 10*3/uL (ref 0.0–0.2)
EOS (ABSOLUTE): 0 10*3/uL (ref 0.0–0.4)
EOS: 1 %
HEMATOCRIT: 34.9 % (ref 34.0–46.6)
HEMOGLOBIN: 11.8 g/dL (ref 11.1–15.9)
HEP B S AG: NEGATIVE
HIV Screen 4th Generation wRfx: NONREACTIVE
IMMATURE GRANULOCYTES: 0 %
Immature Grans (Abs): 0 10*3/uL (ref 0.0–0.1)
LYMPHS ABS: 1.5 10*3/uL (ref 0.7–3.1)
Lymphs: 27 %
MCH: 31 pg (ref 26.6–33.0)
MCHC: 33.8 g/dL (ref 31.5–35.7)
MCV: 92 fL (ref 79–97)
Monocytes Absolute: 0.4 10*3/uL (ref 0.1–0.9)
Monocytes: 7 %
NEUTROS ABS: 3.7 10*3/uL (ref 1.4–7.0)
NEUTROS PCT: 65 %
Platelets: 260 10*3/uL (ref 150–450)
RBC: 3.81 x10E6/uL (ref 3.77–5.28)
RDW: 12.6 % (ref 12.3–15.4)
RH TYPE: NEGATIVE
RPR: NONREACTIVE
Rubella Antibodies, IGG: 1.22 index (ref >0.99)
WBC: 5.7 10*3/uL (ref 3.4–10.8)

## 2018-03-31 LAB — SMN1 COPY NUMBER ANALYSIS (SMA CARRIER SCREENING)

## 2018-03-31 LAB — HEMOGLOBINOPATHY EVALUATION
Ferritin: 68 ng/mL (ref 15–150)
HGB A: 97.4 % (ref 96.4–98.8)
HGB F QUANT: 0 % (ref 0.0–2.0)
HGB S: 0 %
HGB SOLUBILITY: NEGATIVE
Hgb A2 Quant: 2.6 % (ref 1.8–3.2)
Hgb C: 0 %
Hgb Variant: 0 %

## 2018-03-31 LAB — CYSTIC FIBROSIS GENE TEST

## 2018-04-05 ENCOUNTER — Encounter (HOSPITAL_COMMUNITY): Payer: Self-pay | Admitting: *Deleted

## 2018-04-07 ENCOUNTER — Encounter: Payer: Self-pay | Admitting: Family Medicine

## 2018-04-07 ENCOUNTER — Ambulatory Visit (INDEPENDENT_AMBULATORY_CARE_PROVIDER_SITE_OTHER): Payer: Self-pay | Admitting: Family Medicine

## 2018-04-07 VITALS — BP 109/71 | HR 98 | Wt 149.0 lb

## 2018-04-07 DIAGNOSIS — Z8751 Personal history of pre-term labor: Secondary | ICD-10-CM

## 2018-04-07 DIAGNOSIS — Z6791 Unspecified blood type, Rh negative: Secondary | ICD-10-CM

## 2018-04-07 DIAGNOSIS — O26891 Other specified pregnancy related conditions, first trimester: Secondary | ICD-10-CM

## 2018-04-07 DIAGNOSIS — O0991 Supervision of high risk pregnancy, unspecified, first trimester: Secondary | ICD-10-CM

## 2018-04-07 DIAGNOSIS — O09299 Supervision of pregnancy with other poor reproductive or obstetric history, unspecified trimester: Secondary | ICD-10-CM

## 2018-04-07 NOTE — Progress Notes (Signed)
   PRENATAL VISIT NOTE  Subjective:  Rhonda Gordon is a 27 y.o. G3P1101 at 1922w6d being seen today for ongoing prenatal care.  She is currently monitored for the following issues for this high-risk pregnancy and has History of preterm delivery; Supervision of high risk pregnancy, antepartum, first trimester; History of incompetent cervix, currently pregnant; and Rh negative status during pregnancy on their problem list.  Patient reports no complaints.  Contractions: Not present. Vag. Bleeding: None.  Movement: Absent. Denies leaking of fluid.   The following portions of the patient's history were reviewed and updated as appropriate: allergies, current medications, past family history, past medical history, past social history, past surgical history and problem list. Problem list updated.  Objective:   Vitals:   04/07/18 1503  BP: 109/71  Pulse: 98  Weight: 149 lb (67.6 kg)    Fetal Status: Fetal Heart Rate (bpm): 164   Movement: Absent     General:  Alert, oriented and cooperative. Patient is in no acute distress.  Skin: Skin is warm and dry. No rash noted.   Cardiovascular: Normal heart rate noted  Respiratory: Normal respiratory effort, no problems with respiration noted  Abdomen: Soft, gravid, appropriate for gestational age.  Pain/Pressure: Absent     Pelvic: Cervical exam deferred        Extremities: Normal range of motion.  Edema: None  Mental Status: Normal mood and affect. Normal behavior. Normal judgment and thought content.   Assessment and Plan:  Pregnancy: G3P1101 at 2522w6d  1. Supervision of high risk pregnancy, antepartum, first trimester FHT and Fh normal  2. History of incompetent cervix, currently pregnant Scheduled for cerclage  3. History of preterm delivery   4. Rh negative status during pregnancy in first trimester Rhogam at 28 weeks  Preterm labor symptoms and general obstetric precautions including but not limited to vaginal bleeding, contractions,  leaking of fluid and fetal movement were reviewed in detail with the patient. Please refer to After Visit Summary for other counseling recommendations.  No follow-ups on file.  No future appointments.  Levie HeritageJacob J Larry Alcock, DO

## 2018-04-08 ENCOUNTER — Inpatient Hospital Stay (HOSPITAL_COMMUNITY)
Admission: AD | Admit: 2018-04-08 | Discharge: 2018-04-08 | Disposition: A | Discharge status: Home / Self Care | Payer: Medicaid Other | Source: Ambulatory Visit | Attending: Obstetrics & Gynecology | Admitting: Obstetrics & Gynecology

## 2018-04-08 ENCOUNTER — Encounter (HOSPITAL_COMMUNITY): Payer: Self-pay | Admitting: *Deleted

## 2018-04-08 ENCOUNTER — Inpatient Hospital Stay (HOSPITAL_COMMUNITY): Payer: Medicaid Other

## 2018-04-08 ENCOUNTER — Other Ambulatory Visit: Payer: Self-pay

## 2018-04-08 DIAGNOSIS — O26891 Other specified pregnancy related conditions, first trimester: Secondary | ICD-10-CM | POA: Diagnosis not present

## 2018-04-08 DIAGNOSIS — R102 Pelvic and perineal pain: Secondary | ICD-10-CM | POA: Diagnosis not present

## 2018-04-08 DIAGNOSIS — Z3A12 12 weeks gestation of pregnancy: Secondary | ICD-10-CM | POA: Diagnosis not present

## 2018-04-08 DIAGNOSIS — O42919 Preterm premature rupture of membranes, unspecified as to length of time between rupture and onset of labor, unspecified trimester: Secondary | ICD-10-CM

## 2018-04-08 DIAGNOSIS — O0991 Supervision of high risk pregnancy, unspecified, first trimester: Secondary | ICD-10-CM

## 2018-04-08 DIAGNOSIS — R109 Unspecified abdominal pain: Secondary | ICD-10-CM | POA: Insufficient documentation

## 2018-04-08 DIAGNOSIS — N883 Incompetence of cervix uteri: Secondary | ICD-10-CM

## 2018-04-08 LAB — WET PREP, GENITAL
Clue Cells Wet Prep HPF POC: NONE SEEN
SPERM: NONE SEEN
Trich, Wet Prep: NONE SEEN
YEAST WET PREP: NONE SEEN

## 2018-04-08 LAB — URINALYSIS, ROUTINE W REFLEX MICROSCOPIC
BILIRUBIN URINE: NEGATIVE
GLUCOSE, UA: NEGATIVE mg/dL
HGB URINE DIPSTICK: NEGATIVE
Ketones, ur: NEGATIVE mg/dL
NITRITE: NEGATIVE
PH: 7 (ref 5.0–8.0)
Protein, ur: NEGATIVE mg/dL
SPECIFIC GRAVITY, URINE: 1.014 (ref 1.005–1.030)

## 2018-04-08 NOTE — MAU Provider Note (Signed)
History     CSN: [id number]  Arrival date and time: 04/08/18 1112   First Provider Initiated Contact with Patient 04/08/18 1205      Chief Complaint  Patient presents with  . Abdominal Pain   HPI  Ms.  Rhonda Gordon is a [age] y.o. year old 493P1101 female at 5943w0d weeks gestation who presents to MAU reporting "really bad" abdominal pain since yesterday 04/07/18. She reports that she was [facility]18 and started having the abdominal pain that same evening. She denies VB. She has a h/o PTB with 1st pregnancy at 27 wks and cerclage with post-dates vaginal delivery in 2nd pregnancy. She has an appointment to have a cerclage placed on 04/13/18.   Past Medical History:  Diagnosis Date  . Poor dental hygiene    cracked/cavities  . Preterm delivery   . Preterm labor   . SVD (spontaneous vaginal delivery) 08/2013   svd - fetal loss at 27 wks    Past Surgical History:  Procedure Laterality Date  . CERVICAL CERCLAGE N/A 02/15/2014   Procedure: CERCLAGE CERVICAL;  Surgeon: Catalina AntiguaPeggy Constant, MD;  Location: [facility];  Service: Gynecology;  Laterality: N/A;  . TOOTH EXTRACTION      Family History  Problem Relation Age of Onset  . Hypertension Mother   . Hypertension Sister     Social History   Tobacco Use  . Smoking status: Never Smoker  . Smokeless tobacco: Never Used  Substance Use Topics  . Alcohol use: No  . Drug use: No    Allergies: No Known Allergies  No medications prior to admission.    Review of Systems  Constitutional: Negative.   HENT: Negative.   Eyes: Negative.   Respiratory: Negative.   Cardiovascular: Negative.   Gastrointestinal: Positive for abdominal pain (really bad cramping).  Endocrine: Negative.   Genitourinary: Positive for pelvic pain.  Musculoskeletal: Negative.   Skin: Negative.   Allergic/Immunologic: Negative.   Neurological: Negative.   Hematological: Negative.   Psychiatric/Behavioral: Negative.    Physical Exam   Blood  pressure 110/60, pulse 88, temperature 98.1 F (36.7 C), temperature source Oral, resp. rate 16, weight 67.9 kg, last menstrual period 01/17/2018, SpO2 100 %.  Physical Exam  Nursing note and vitals reviewed. Constitutional: She is oriented to person, place, and time. She appears well-developed and well-nourished.  HENT:  Head: Normocephalic and atraumatic.  Eyes: Pupils are equal, round, and reactive to light.  Neck: Normal range of motion.  Cardiovascular: Normal rate.  Respiratory: Effort normal.  GI: Soft.  Musculoskeletal: Normal range of motion.  Neurological: She is alert and oriented to person, place, and time.  Skin: Skin is warm and dry.  Psychiatric: She has a normal mood and affect. Her behavior is normal. Judgment and thought content normal.    MAU Course  Procedures  MDM Wet Prep GC/CT -- pending OB < 14 wks U/S   Results for orders placed or performed during the hospital encounter of 04/08/18 (from the past 24 hour(s))  Urinalysis, Routine w reflex microscopic     Status: Abnormal   Collection Time: 04/08/18 11:44 AM  Result Value Ref Range   Color, Urine YELLOW YELLOW   APPearance HAZY (A) CLEAR   Specific Gravity, Urine 1.014 1.005 - 1.030   pH 7.0 5.0 - 8.0   Glucose, UA NEGATIVE NEGATIVE mg/dL   Hgb urine dipstick NEGATIVE NEGATIVE   Bilirubin Urine NEGATIVE NEGATIVE   Ketones, ur NEGATIVE NEGATIVE mg/dL   Protein,  ur NEGATIVE NEGATIVE mg/dL   Nitrite NEGATIVE NEGATIVE   Leukocytes, UA SMALL (A) NEGATIVE   WBC, UA 0-5 0 - 5 WBC/hpf   Bacteria, UA RARE (A) NONE SEEN   Squamous Epithelial / LPF 6-10 0 - 5   Mucus PRESENT   Wet prep, genital     Status: Abnormal   Collection Time: 04/08/18 12:24 PM  Result Value Ref Range   Yeast Wet Prep HPF POC NONE SEEN NONE SEEN   Trich, Wet Prep NONE SEEN NONE SEEN   Clue Cells Wet Prep HPF POC NONE SEEN NONE SEEN   WBC, Wet Prep HPF POC MANY (A) NONE SEEN   Sperm NONE SEEN     US Ob Less Than 14 Weeks With  Ob Transvaginal  Result Date: 04/08/2018 CLINICAL DATA:  Pelvic pain and cramping in 1st trimester pregnancy. Assigned gestational age of [redacted] weeks 0 days by prior ultrasound. EXAM: OBSTETRIC <14 WK [place] AND TRANSVAGINAL OB US TECHNIQUE: Both transabdominal and transvaginal ultrasound examinations were performed for complete evaluation of the gestation as well as the maternal uterus, adnexal regions, and pelvic cul-de-sac. Transvaginal technique was performed to assess early pregnancy. COMPARISON:  02/25/2018 FINDINGS: Intrauterine gestational sac: Single Yolk sac:  Not Visualized. Embryo:  Visualized. Cardiac Activity: Visualized. Heart Rate: 161 bpm CRL:  64 mm   12 w   5 d                  [place] EDC: 10/16/2018 Subchorionic hemorrhage:  None visualized. Maternal uterus/adnexae: Normal appearance of both ovaries. No mass or abnormal free fluid identified. IMPRESSION: Single living IUP with assigned gestational age of [redacted] weeks 0 days by prior ultrasound. Appropriate interval growth. No significant maternal uterine or adnexal abnormality identified. Electronically Signed   By: Myles Rosenthal M.D.   On: 04/08/2018 15:23    Assessment and Plan  Abdominal pain in pregnancy, first trimester - Reassurance given that all tests and U/S results were negative from today's visit - Information provided on abdominal pain in pregnancy   Incompetent cervix  - Keep scheduled surgery on 04/13/18  -Discharge home - Patient verbalized an understanding of the plan of care and agrees.   Raelyn Mora, MSN, CNM 04/08/2018, 3:37 PM

## 2018-04-08 NOTE — MAU Note (Signed)
Pt has a history of a 21 week preterm delivery, cerclage with second pregnancy and has appointment for cerclage on September the 25th.

## 2018-04-08 NOTE — MAU Note (Signed)
Stomach is cramping and hurting really bad. Started yesterday. No bleeding or d/c.

## 2018-04-08 NOTE — Discharge Instructions (Signed)

## 2018-04-09 ENCOUNTER — Encounter (HOSPITAL_COMMUNITY): Payer: Self-pay | Admitting: Emergency Medicine

## 2018-04-09 ENCOUNTER — Other Ambulatory Visit: Payer: Self-pay

## 2018-04-09 ENCOUNTER — Emergency Department (HOSPITAL_COMMUNITY)
Admission: EM | Admit: 2018-04-09 | Discharge: 2018-04-09 | Disposition: A | Discharge status: Home / Self Care | Payer: Medicaid Other | Attending: Emergency Medicine | Admitting: Emergency Medicine

## 2018-04-09 DIAGNOSIS — K047 Periapical abscess without sinus: Secondary | ICD-10-CM | POA: Diagnosis not present

## 2018-04-09 DIAGNOSIS — K0889 Other specified disorders of teeth and supporting structures: Secondary | ICD-10-CM | POA: Diagnosis present

## 2018-04-09 HISTORY — DX: Encounter for supervision of normal pregnancy, unspecified, unspecified trimester: Z34.90

## 2018-04-09 MED ORDER — PENICILLIN V POTASSIUM 500 MG PO TABS: 500.0000 mg | ORAL_TABLET | Freq: Four times a day (QID) | ORAL | 0 refills | Status: AC

## 2018-04-09 NOTE — ED Provider Notes (Signed)
[facility] EMERGENCY DEPARTMENT Provider Note   CSN: [id number] Arrival date & time: 04/09/18  1191     History   Chief Complaint Chief Complaint  Patient presents with  . Dental Pain    HPI Rhonda Gordon is a 27 y.o. female.  27 year old female, [redacted] weeks pregnant, presents with complaint of left upper dental pain.  Patient reports possible dental caries, states she has had drainage from his left upper tooth area for a long time however pain started early this morning.  She denies trauma, fevers.  Patient has routine follow-up OB care.  No other complaints or concerns.     Past Medical History:  Diagnosis Date  . Poor dental hygiene    cracked/cavities  . Pregnant   . Preterm delivery   . Preterm labor   . SVD (spontaneous vaginal delivery) 08/2013   svd - fetal loss at 27 wks    Patient Active Problem List   Diagnosis Date Noted  . Supervision of high risk pregnancy, antepartum, first trimester 03/23/2018  . History of incompetent cervix, currently pregnant 03/23/2018  . Rh negative status during pregnancy 03/23/2018  . History of preterm delivery     Past Surgical History:  Procedure Laterality Date  . CERVICAL CERCLAGE N/A 02/15/2014   Procedure: CERCLAGE CERVICAL;  Surgeon: Catalina Antigua, MD;  Location: [facility];  Service: Gynecology;  Laterality: N/A;  . TOOTH EXTRACTION       OB History    Gravida  3   Para  2   Term  1   Preterm  1   AB  0   Living  1     SAB  0   TAB  0   Ectopic  0   Multiple  0   Live Births  1            Home Medications    Prior to Admission medications   Medication Sig Start Date End Date Taking? Authorizing Provider  metroNIDAZOLE (FLAGYL) 500 MG tablet Take 1 tablet (500 mg total) by mouth 2 (two) times daily. 03/25/18   Hermina Staggers, MD  penicillin v potassium (VEETID) 500 MG tablet Take 1 tablet (500 mg total) by mouth 4 (four) times daily for 10 days. 04/09/18 04/19/18  Jeannie Fend, PA-C    Family History Family History  Problem Relation Age of Onset  . Hypertension Mother   . Hypertension Sister     Social History Social History   Tobacco Use  . Smoking status: Never Smoker  . Smokeless tobacco: Never Used  Substance Use Topics  . Alcohol use: No  . Drug use: No     Allergies   Patient has no known allergies.   Review of Systems Review of Systems  Constitutional: Negative for chills and fever.  HENT: Positive for dental problem. Negative for facial swelling, trouble swallowing and voice change.   Gastrointestinal: Negative for vomiting.  Musculoskeletal: Negative for neck pain and neck stiffness.  Skin: Negative for rash and wound.  Allergic/Immunologic: Negative for immunocompromised state.  Neurological: Negative for headaches.  Hematological: Negative for adenopathy.  Psychiatric/Behavioral: Negative for confusion.  All other systems reviewed and are negative.    Physical Exam Updated Vital Signs BP 110/77 (BP Location: Right Arm)   Pulse 80   Temp 98.5 F (36.9 C) (Oral)   Resp 18   Ht 5\' 6"  (1.676 m)   Wt 66.7 kg   LMP 01/17/2018 (Approximate)  SpO2 100%   BMI 23.73 kg/m   Physical Exam  Constitutional: She is oriented to person, place, and time. She appears well-developed and well-nourished. No distress.  HENT:  Head: Normocephalic and atraumatic.  Mouth/Throat: Uvula is midline. No trismus in the jaw. Abnormal dentition. Dental caries present. No dental abscesses.    Neck: Neck supple.  Pulmonary/Chest: Effort normal.  Lymphadenopathy:    She has no cervical adenopathy.  Neurological: She is alert and oriented to person, place, and time.  Skin: Skin is warm and dry. She is not diaphoretic. No erythema.  Psychiatric: She has a normal mood and affect. Her behavior is normal.  Nursing note and vitals reviewed.    ED Treatments / Results  Labs (all labs ordered are listed, but only abnormal results are  displayed) Labs Reviewed - No data to display  EKG None  Radiology [place] Ob Less Than 14 Weeks With Ob Transvaginal  Result Date: 04/08/2018 CLINICAL DATA:  Pelvic pain and cramping in 1st trimester pregnancy. Assigned gestational age of [redacted] weeks 0 days by prior ultrasound. EXAM: OBSTETRIC <14 WK US AND TRANSVAGINAL OB US TECHNIQUE: Both transabdominal and transvaginal ultrasound examinations were performed for complete evaluation of the gestation as well as the maternal uterus, adnexal regions, and pelvic cul-de-sac. Transvaginal technique was performed to assess early pregnancy. COMPARISON:  02/25/2018 FINDINGS: Intrauterine gestational sac: Single Yolk sac:  Not Visualized. Embryo:  Visualized. Cardiac Activity: Visualized. Heart Rate: 161 bpm CRL:  64 mm   12 w   5 d                  US EDC: 10/16/2018 Subchorionic hemorrhage:  None visualized. Maternal uterus/adnexae: Normal appearance of both ovaries. No mass or abnormal free fluid identified. IMPRESSION: Single living IUP with assigned gestational age of [redacted] weeks 0 days by prior ultrasound. Appropriate interval growth. No significant maternal uterine or adnexal abnormality identified. Electronically Signed   By: Myles RosenthalJohn  Stahl M.D.   On: 04/08/2018 15:23    Procedures Procedures (including critical care time)  Medications Ordered in ED Medications - No data to display   Initial Impression / Assessment and Plan / ED Course  I have reviewed the triage vital signs and the nursing notes.  Pertinent labs & imaging results that were available during my care of the patient were reviewed by me and considered in my medical decision making (see chart for details).  Clinical Course as of Apr 09 910  Sat Apr 09, 2018  [id number] 27 year old gravida female with left upper dental pain and severe tooth decay.  No obvious abscess, no trismus.  Patient was given prescription for penicillin, recommend Tylenol for her pain.  Advised her to notify her OB at her  next visit of her poor dental status.  Patient has a history of preterm birth.   [LM]    Clinical Course User Index [LM] Jeannie FendMurphy, Avary Eichenberger A, PA-C    Final Clinical Impressions(s) / ED Diagnoses   Final diagnoses:  Dental abscess    ED Discharge Orders         Ordered    penicillin v potassium (VEETID) 500 MG tablet  4 times daily     04/09/18 0907           Jeannie FendMurphy, Daviel Allegretto A, PA-C 04/09/18 [id number]    Gilda CreasePollina, Christopher J, MD 04/10/18 [id number]

## 2018-04-09 NOTE — Discharge Instructions (Addendum)
Pain- Tylenol every 6 hours.   Take penicillin as prescribed and complete the full course. Rinse and spit with Listerine or salt water after every meal. Continue to brush with a soft toothbrush. Up with a dentist as soon as possible, see resource list and discharge instructions.

## 2018-04-09 NOTE — ED Notes (Signed)
ED Provider at bedside. 

## 2018-04-09 NOTE — ED Triage Notes (Signed)
Pt. Stated, my teeth are hurting me bad, the pain woke me up last night out of my sleep

## 2018-04-11 ENCOUNTER — Encounter: Payer: Self-pay | Admitting: *Deleted

## 2018-04-11 LAB — GC/CHLAMYDIA PROBE AMP (~~LOC~~) NOT AT ARMC
Chlamydia: POSITIVE — AB
Neisseria Gonorrhea: NEGATIVE

## 2018-04-12 ENCOUNTER — Encounter: Payer: Self-pay | Admitting: Obstetrics and Gynecology

## 2018-04-12 ENCOUNTER — Other Ambulatory Visit: Payer: Self-pay | Admitting: Obstetrics and Gynecology

## 2018-04-12 ENCOUNTER — Telehealth: Payer: Self-pay | Admitting: Obstetrics and Gynecology

## 2018-04-12 ENCOUNTER — Encounter (HOSPITAL_COMMUNITY): Payer: Self-pay

## 2018-04-12 DIAGNOSIS — O98811 Other maternal infectious and parasitic diseases complicating pregnancy, first trimester: Principal | ICD-10-CM

## 2018-04-12 DIAGNOSIS — A749 Chlamydial infection, unspecified: Secondary | ICD-10-CM

## 2018-04-12 DIAGNOSIS — O98812 Other maternal infectious and parasitic diseases complicating pregnancy, second trimester: Secondary | ICD-10-CM

## 2018-04-12 NOTE — Progress Notes (Signed)
TC to patient to notify of (+) CT results. She states that she "took those 2 pills Friday (04/08/18). So it's probably still showing up." Notified that Dr. Vergie LivingPickens who will be doing her cerclage tomorrow was also notified. Patient verbalized an understanding.

## 2018-04-12 NOTE — Telephone Encounter (Signed)
OB Telephone Note Patient +for CT on 9/20 and states she was treated then. Pt 12/4 weeks today and for ppx cerclage tomorrow. I told her I recommend retest on 10/4 and set up for 10/7 ppx cerclage placement, to make sure infection has cleared. I told her that ppx cerclages are usually done b/w 12-14wks so shouldn't be an issue. Pt amenable to plan  Cornelia Copaharlie Demetruis Depaul, Jr MD Attending Center [facility] 04/12/2018 Time: 1231pm

## 2018-04-20 ENCOUNTER — Encounter (HOSPITAL_COMMUNITY): Payer: Self-pay | Admitting: *Deleted

## 2018-04-20 ENCOUNTER — Telehealth (HOSPITAL_COMMUNITY): Payer: Self-pay | Admitting: *Deleted

## 2018-04-20 NOTE — Telephone Encounter (Signed)
Preadmission screen  

## 2018-04-22 ENCOUNTER — Ambulatory Visit (INDEPENDENT_AMBULATORY_CARE_PROVIDER_SITE_OTHER): Payer: Medicaid Other | Admitting: General Practice

## 2018-04-22 ENCOUNTER — Other Ambulatory Visit (HOSPITAL_COMMUNITY)
Admission: RE | Admit: 2018-04-22 | Discharge: 2018-04-22 | Disposition: A | Discharge status: Home / Self Care | Payer: Medicaid Other | Source: Ambulatory Visit | Attending: Obstetrics & Gynecology | Admitting: Obstetrics & Gynecology

## 2018-04-22 DIAGNOSIS — A749 Chlamydial infection, unspecified: Secondary | ICD-10-CM

## 2018-04-22 DIAGNOSIS — O98812 Other maternal infectious and parasitic diseases complicating pregnancy, second trimester: Secondary | ICD-10-CM

## 2018-04-22 NOTE — Progress Notes (Signed)
Patient presents to office today for [facility] following recent + chlamydia. Patient instructed in self swab & specimen collected. Discussed results will be back within 24-48 hours. Patient verbalized understanding & had no questions.

## 2018-04-25 ENCOUNTER — Ambulatory Visit (HOSPITAL_COMMUNITY): Payer: Medicaid Other | Admitting: Certified Registered"

## 2018-04-25 ENCOUNTER — Encounter (HOSPITAL_COMMUNITY)
Admission: AD | Disposition: A | Discharge status: Home / Self Care | Payer: Self-pay | Source: Ambulatory Visit | Attending: Obstetrics & Gynecology

## 2018-04-25 ENCOUNTER — Ambulatory Visit (HOSPITAL_COMMUNITY)
Admission: AD | Admit: 2018-04-25 | Discharge: 2018-04-25 | Disposition: A | Discharge status: Home / Self Care | Payer: Medicaid Other | Source: Ambulatory Visit | Attending: Obstetrics & Gynecology | Admitting: Obstetrics & Gynecology

## 2018-04-25 ENCOUNTER — Encounter (HOSPITAL_COMMUNITY): Payer: Self-pay | Admitting: *Deleted

## 2018-04-25 DIAGNOSIS — O3432 Maternal care for cervical incompetence, second trimester: Secondary | ICD-10-CM | POA: Insufficient documentation

## 2018-04-25 DIAGNOSIS — Z3A14 14 weeks gestation of pregnancy: Secondary | ICD-10-CM | POA: Diagnosis not present

## 2018-04-25 DIAGNOSIS — O09292 Supervision of pregnancy with other poor reproductive or obstetric history, second trimester: Secondary | ICD-10-CM

## 2018-04-25 DIAGNOSIS — O09299 Supervision of pregnancy with other poor reproductive or obstetric history, unspecified trimester: Secondary | ICD-10-CM

## 2018-04-25 DIAGNOSIS — Z8751 Personal history of pre-term labor: Secondary | ICD-10-CM

## 2018-04-25 DIAGNOSIS — O0991 Supervision of high risk pregnancy, unspecified, first trimester: Secondary | ICD-10-CM

## 2018-04-25 HISTORY — PX: CERVICAL CERCLAGE: SHX1329

## 2018-04-25 HISTORY — DX: Other specified personal risk factors, not elsewhere classified: Z91.89

## 2018-04-25 LAB — CBC
HCT: 36.2 % (ref 36.0–46.0)
Hemoglobin: 12.7 g/dL (ref 12.0–15.0)
MCH: 32.1 pg (ref 26.0–34.0)
MCHC: 35.1 g/dL (ref 30.0–36.0)
MCV: 91.4 fL (ref 78.0–100.0)
PLATELETS: 235 10*3/uL (ref 150–400)
RBC: 3.96 MIL/uL (ref 3.87–5.11)
RDW: 13.1 % (ref 11.5–15.5)
WBC: 6.1 10*3/uL (ref 4.0–10.5)

## 2018-04-25 LAB — GC/CHLAMYDIA PROBE AMP (~~LOC~~) NOT AT ARMC
Chlamydia: NEGATIVE
NEISSERIA GONORRHEA: NEGATIVE

## 2018-04-25 LAB — TYPE AND SCREEN
ABO/RH(D): A NEG
ANTIBODY SCREEN: NEGATIVE

## 2018-04-25 SURGERY — CERCLAGE, CERVIX, VAGINAL APPROACH
Anesthesia: Spinal

## 2018-04-25 MED ORDER — DOCUSATE SODIUM 100 MG PO CAPS: 100.0000 mg | ORAL_CAPSULE | Freq: Two times a day (BID) | ORAL | 2 refills | Status: DC | PRN

## 2018-04-25 MED ORDER — SODIUM CHLORIDE 0.9 % IR SOLN
Status: DC | PRN
  Administered 2018-04-25: 1000 mL

## 2018-04-25 MED ORDER — SOD CITRATE-CITRIC ACID 500-334 MG/5ML PO SOLN
30.0000 mL | ORAL | Status: AC
  Administered 2018-04-25: 30 mL via ORAL
  Filled 2018-04-25: qty 15

## 2018-04-25 MED ORDER — LACTATED RINGERS IV SOLN: INTRAVENOUS | Status: DC

## 2018-04-25 MED ORDER — INDOMETHACIN 50 MG RE SUPP
100.0000 mg | Freq: Once | RECTAL | Status: DC
  Filled 2018-04-25: qty 2

## 2018-04-25 MED ORDER — LACTATED RINGERS IV SOLN
INTRAVENOUS | Status: DC
  Administered 2018-04-25 (×2): via INTRAVENOUS

## 2018-04-25 MED ORDER — OXYCODONE-ACETAMINOPHEN 5-325 MG PO TABS: 1.0000 | ORAL_TABLET | ORAL | 0 refills | Status: DC | PRN

## 2018-04-25 MED ORDER — OXYCODONE-ACETAMINOPHEN 5-325 MG PO TABS
1.0000 | ORAL_TABLET | Freq: Four times a day (QID) | ORAL | Status: DC | PRN
  Administered 2018-04-25: 1 via ORAL
  Filled 2018-04-25: qty 2

## 2018-04-25 MED ORDER — INDOMETHACIN 50 MG RE SUPP
RECTAL | Status: DC | PRN
  Administered 2018-04-25: 100 mg via RECTAL

## 2018-04-25 SURGICAL SUPPLY — 18 items
CANISTER SUCT 3000ML PPV (MISCELLANEOUS) ×3 IMPLANT
ELECT REM PT RETURN 9FT ADLT (ELECTROSURGICAL)
ELECTRODE REM PT RTRN 9FT ADLT (ELECTROSURGICAL) IMPLANT
GLOVE BIOGEL PI IND STRL 7.0 (GLOVE) ×3 IMPLANT
GLOVE BIOGEL PI INDICATOR 7.0 (GLOVE) ×6
GLOVE ECLIPSE 7.0 STRL STRAW (GLOVE) ×3 IMPLANT
GOWN STRL REUS W/TWL LRG LVL3 (GOWN DISPOSABLE) ×6 IMPLANT
PACK VAGINAL MINOR WOMEN LF (CUSTOM PROCEDURE TRAY) ×3 IMPLANT
PAD OB MATERNITY 4.3X12.25 (PERSONAL CARE ITEMS) ×3 IMPLANT
PAD PREP 24X48 CUFFED NSTRL (MISCELLANEOUS) ×3 IMPLANT
PENCIL BUTTON HOLSTER BLD 10FT (ELECTRODE) IMPLANT
SUT PROLENE 1 CT 1 30 (SUTURE) ×3 IMPLANT
SYR BULB IRRIGATION 50ML (SYRINGE) IMPLANT
TOWEL OR 17X24 6PK STRL BLUE (TOWEL DISPOSABLE) ×6 IMPLANT
TRAY FOLEY W/BAG SLVR 14FR (SET/KITS/TRAYS/PACK) ×3 IMPLANT
TUBING NON-CON 1/4 X 20 CONN (TUBING) IMPLANT
TUBING NON-CON 1/4 X 20' CONN (TUBING)
YANKAUER SUCT BULB TIP NO VENT (SUCTIONS) IMPLANT

## 2018-04-25 NOTE — H&P (Signed)
Preoperative History and Physical  Rhonda Gordon is a 27 y.o. G3P1101 at [redacted]w[redacted]d here for surgical management of history of cervical incompetence.   No significant preoperative concerns.  Proposed surgery: Cervical cerclage placement  Past Medical History:  Diagnosis Date  . Poor dental hygiene    cracked/cavities  . Pregnant   . Preterm delivery   . Preterm labor   . SVD (spontaneous vaginal delivery) 08/2013   svd - fetal loss at 27 wks   Past Surgical History:  Procedure Laterality Date  . CERVICAL CERCLAGE N/A 02/15/2014   Procedure: CERCLAGE CERVICAL;  Surgeon: Catalina Antigua, MD;  Location: [facility]S;  Service: Gynecology;  Laterality: N/A;  . TOOTH EXTRACTION     OB History  Gravida Para Term Preterm AB Living  3 2 1 1  0 1  SAB TAB Ectopic Multiple Live Births  0 0 0 0 1    # Outcome Date GA Lbr Len/2nd Weight Sex Delivery Anes PTL Lv  3 Current           2 Term 08/28/14 [redacted]w[redacted]d 05:32 / 00:37 3950 g F Vag-Spont EPI  LIV     Birth Comments: Required blow-by oxygen in the delivery room, then placed under oxyhood for low saturations. Weaned to room air after 1.5 hours and transferred to nursery.   1 Preterm 09/15/13 [redacted]w[redacted]d -14:11 / 00:03 374 g M Vag-Spont None  FD  Patient denies any other pertinent gynecologic issues.   No current facility-administered medications on file prior to encounter.    No current outpatient medications on file prior to encounter.   No Known Allergies  Social History:   reports that she has never smoked. She has never used smokeless tobacco. She reports that she does not drink alcohol or use drugs.  Family History  Problem Relation Age of Onset  . Hypertension Mother   . Hypertension Sister     Review of Systems: Pertinent items noted in HPI and remainder of comprehensive ROS otherwise negative.  PHYSICAL EXAM: Blood pressure 106/62, pulse 90, temperature 98.3 F (36.8 C), temperature source Oral, resp. rate 18, height 5\' 6"  (1.676 m),  weight 66.1 kg, last menstrual period 01/17/2018, unknown if currently breastfeeding.  FHR 160 bpm CONSTITUTIONAL: Well-developed, well-nourished female in no acute distress.  HENT:  Normocephalic, atraumatic, External right and left ear normal. Oropharynx is clear and moist EYES: Conjunctivae and EOM are normal. Pupils are equal, round, and reactive to light. No scleral icterus.  NECK: Normal range of motion, supple, no masses SKIN: Skin is warm and dry. No rash noted. Not diaphoretic. No erythema. No pallor. NEUROLOGIC: Alert and oriented to person, place, and time. Normal reflexes, muscle tone coordination. No cranial nerve deficit noted. PSYCHIATRIC: Normal mood and affect. Normal behavior. Normal judgment and thought content. CARDIOVASCULAR: Normal heart rate noted, regular rhythm RESPIRATORY: Effort and breath sounds normal, no problems with respiration noted ABDOMEN: Soft, nontender, nondistended, gravid. PELVIC: Deferred MUSCULOSKELETAL: Normal range of motion. No edema and no tenderness. 2+ distal pulses.  Labs: Results for orders placed or performed in visit on 04/22/18 (from the past 336 hour(s))  GC/Chlamydia probe amp ([place])not at [facility]   Collection Time: 04/22/18 12:00 AM  Result Value Ref Range   Chlamydia Negative    Neisseria gonorrhea Negative     Imaging Studies: US Ob Less Than 14 Weeks With Ob Transvaginal  Result Date: 04/08/2018 CLINICAL DATA:  Pelvic pain and cramping in 1st trimester pregnancy. Assigned gestational age of [redacted] weeks  0 days by prior ultrasound. EXAM: OBSTETRIC <14 WK [place] AND TRANSVAGINAL OB US TECHNIQUE: Both transabdominal and transvaginal ultrasound examinations were performed for complete evaluation of the gestation as well as the maternal uterus, adnexal regions, and pelvic cul-de-sac. Transvaginal technique was performed to assess early pregnancy. COMPARISON:  02/25/2018 FINDINGS: Intrauterine gestational sac: Single Yolk sac:  Not  Visualized. Embryo:  Visualized. Cardiac Activity: Visualized. Heart Rate: 161 bpm CRL:  64 mm   12 w   5 d                  [place] EDC: 10/16/2018 Subchorionic hemorrhage:  None visualized. Maternal uterus/adnexae: Normal appearance of both ovaries. No mass or abnormal free fluid identified. IMPRESSION: Single living IUP with assigned gestational age of [redacted] weeks 0 days by prior ultrasound. Appropriate interval growth. No significant maternal uterine or adnexal abnormality identified. Electronically Signed   By: Myles Rosenthal M.D.   On: 04/08/2018 15:23   Assessment: Principal Problem:   History of incompetent cervix, currently pregnant Active Problems:   History of preterm delivery   Supervision of high risk pregnancy, antepartum, first trimester  Plan: Patient will undergo surgical management with cervical cerclage placement.   The risks of surgery were discussed in detail with the patient including but not limited to: bleeding; infection which may require antibiotic therapy; injury to cervix, vagina other surrounding organs; risk of ruptured membranes and/or preterm delivery and other postoperative or anesthesia complications.  Likelihood of success in alleviating the patient's condition was discussed. Routine postoperative instructions will be reviewed with the patient and her family in detail after surgery.  The patient concurred with the proposed plan, giving informed written consent for the surgery.  Patient has been NPO since last night and she will remain NPO for procedure.  Anesthesia and OR aware.  Preoperative SCDs ordered on call to the OR.  To OR when ready.    Jaynie Collins, MD, FACOG Obstetrician & Gynecologist, [facility] for [employer], [facility]

## 2018-04-25 NOTE — Anesthesia Procedure Notes (Signed)
Spinal  Patient location during procedure: OR Staffing Anesthesiologist: Marua Qin, MD Performed: anesthesiologist  Preanesthetic Checklist Completed: patient identified, site marked, surgical consent, pre-op evaluation, timeout performed, IV checked, risks and benefits discussed and monitors and equipment checked Spinal Block Patient position: sitting Prep: DuraPrep Patient monitoring: heart rate, continuous pulse ox and blood pressure Approach: midline Location: L3-4 Injection technique: single-shot Needle Needle type: Sprotte  Needle gauge: 24 G Needle length: 9 cm Additional Notes Expiration date of kit checked and confirmed. Patient tolerated procedure well, without complications.       

## 2018-04-25 NOTE — Anesthesia Preprocedure Evaluation (Signed)
Anesthesia Evaluation  Patient identified by MRN, date of birth, ID band Patient awake    Reviewed: Allergy & Precautions, NPO status , Patient's Chart, lab work & pertinent test results  Airway Mallampati: II  TM Distance: >3 FB Neck ROM: Full    Dental no notable dental hx.    Pulmonary neg pulmonary ROS,    Pulmonary exam normal breath sounds clear to auscultation       Cardiovascular negative cardio ROS Normal cardiovascular exam Rhythm:Regular Rate:Normal     Neuro/Psych negative neurological ROS  negative psych ROS   GI/Hepatic negative GI ROS, Neg liver ROS,   Endo/Other  negative endocrine ROS  Renal/GU negative Renal ROS  negative genitourinary   Musculoskeletal negative musculoskeletal ROS (+)   Abdominal   Peds negative pediatric ROS (+)  Hematology negative hematology ROS (+)   Anesthesia Other Findings   Reproductive/Obstetrics negative OB ROS                             Anesthesia Physical Anesthesia Plan  ASA: II  Anesthesia Plan: Spinal   Post-op Pain Management:    Induction:   PONV Risk Score and Plan: 2  Airway Management Planned: Natural Airway  Additional Equipment:   Intra-op Plan:   Post-operative Plan:   Informed Consent: I have reviewed the patients History and Physical, chart, labs and discussed the procedure including the risks, benefits and alternatives for the proposed anesthesia with the patient or authorized representative who has indicated his/her understanding and acceptance.   Dental advisory given  Plan Discussed with:   Anesthesia Plan Comments:         Anesthesia Quick Evaluation

## 2018-04-25 NOTE — Transfer of Care (Signed)
Immediate Anesthesia Transfer of Care Note  Patient: Rhonda Gordon  Procedure(s) Performed: CERCLAGE CERVICAL (N/A )  Patient Location: PACU  Anesthesia Type:Spinal  Level of Consciousness: awake, alert  and oriented  Airway & Oxygen Therapy: Patient Spontanous Breathing  Post-op Assessment: Report given to RN and Post -op Vital signs reviewed and stable  Post vital signs: Reviewed  Last Vitals:  Vitals Value Taken Time  BP 108/65 04/25/2018  1:32 PM  Temp    Pulse 66 04/25/2018  1:38 PM  Resp 18 04/25/2018  1:38 PM  SpO2 100 % 04/25/2018  1:38 PM  Vitals shown include unvalidated device data.  Last Pain:  Vitals:   04/25/18 1044  TempSrc: Oral  PainSc:       Patients Stated Pain Goal: 4 (04/25/18 1029)  Complications: No apparent anesthesia complications

## 2018-04-25 NOTE — Anesthesia Postprocedure Evaluation (Signed)
Anesthesia Post Note  Patient: Rhonda Gordon  Procedure(s) Performed: CERCLAGE CERVICAL (N/A )     Patient location during evaluation: PACU Anesthesia Type: Spinal Level of consciousness: oriented and awake and alert Pain management: pain level controlled Vital Signs Assessment: post-procedure vital signs reviewed and stable Respiratory status: spontaneous breathing and respiratory function stable Cardiovascular status: blood pressure returned to baseline and stable Postop Assessment: no headache, no backache and no apparent nausea or vomiting Anesthetic complications: no    Last Vitals:  Vitals:   04/25/18 1440 04/25/18 1555  BP: 114/63 108/66  Pulse: 72 81  Resp: 17 16  Temp: 37.3 C 37.1 C  SpO2: 100% 100%    Last Pain:  Vitals:   04/25/18 2010  TempSrc:   PainSc: 1    Pain Goal: Patients Stated Pain Goal: 2 (04/25/18 1725)               Lowella Curb

## 2018-04-25 NOTE — Op Note (Signed)
Rhonda Gordon    PROCEDURE DATE: 04/25/2018  PREOPERATIVE DIAGNOSIS: Intrauterine pregnancy at [redacted]w[redacted]d, history of cervical incompetence   POSTOPERATIVE DIAGNOSIS: The same PROCEDURE: Transvaginal McDonald Cervical Cerclage Placement SURGEON:  Dr. Jaynie Collins  INDICATIONS: 27 y.o. G3P1101 at [redacted]w[redacted]d with history of cervical incompetence, here for cerclage placement.   The risks of surgery were discussed in detail with the patient including but not limited to: bleeding; infection which may require antibiotic therapy; injury to cervix, vagina other surrounding organs; risk of ruptured membranes and/or preterm delivery and other postoperative or anesthesia complications.  Written informed consent was obtained.    FINDINGS:  About 2 cm palpable cervical length in the vagina, closed cervix, suture knot placed anteriorly.  ANESTHESIA:  Spinal ESTIMATED BLOOD LOSS: 10 ml COMPLICATIONS: None immediate  PROCEDURE IN DETAIL:  The patient received intravenous antibiotics and had sequential compression devices applied to her lower extremities while in the preoperative area.  Reassuring fetal heart rate was also obtained using a doppler. She was then taken to the operating room where spinal anesthesia was administered and was found to be adequate.  She was placed in the dorsal lithotomy, and was prepped and draped in a sterile manner. Her bladder was catheterized for an unmeasured amount of clear, yellow urine.  After an adequate timeout was performed, a vaginal speculum was then placed in the patient's vagina.  The anterior and posterior lips of the cervix were grasped with ring forceps. A curved needle with a 1-0 Prolene suture was inserted at 12 o'clock, as high as possible at the junction of the rugated vaginal epithelium and the smooth cervix, at least 2 cm above the external os.  Four bites are taken circumferentially around the entire cervix in a purse-string fashion, each bite should be deep enough to  extend at least midway into the cervical stroma, but not into the endocervical canal. The two ends of the suture were then tied securely anteriorly and cut, leaving the ends long enough to grasp with a clamp when it is time to remove it. A paracervical block using 0.5 % Marcaine was administered. There was minimal bleeding noted and the ring forceps were removed with good hemostasis noted.  No immediate complications noted.  All instruments were removed from the patient's vagina.  Indomethacin 100 mg rectal suppository was placed.  Instrument, needle and sponge counts were correct x 2. The patient tolerated the procedure well, and was taken to the recovery area awake and in stable condition.  Reassuring fetal heart rate was also obtained using a doppler in the recovery area.  The patient will be discharged to home as per PACU criteria.  Routine postoperative instructions given.  She was prescribed Percocet and Colace.  She will follow up in the clinic on 05/05/2018 for postoperative evaluation and ongoing prenatal care.   Jaynie Collins, MD, FACOG Obstetrician & Gynecologist, [facility] for [employer], [facility]

## 2018-04-25 NOTE — Discharge Instructions (Signed)
Cerclage of the Cervix Cerclage of the cervix is a surgical procedure for an incompetent cervix. An incompetent cervix is a weak cervix that opens up before labor begins. Cerclage of the cervix sews the cervix closed during pregnancy.  LET YOUR CAREGIVER KNOW ABOUT:  Allergies to foods or medications. All over-the-counter, prescription, herbal, eye drops and cream medications you are using. Taking illegal drugs or drinking an excessive amount of alcohol. Any recent colds or infections. Past problems with anesthetics or novocaine. Past surgery. History of blood clots or abnormal bleeding problems. Other medical or health problems. RISKS AND COMPLICATIONS  Infection. Bleeding. Rupturing the amniotic sac (membranes). Going into early labor and delivery. Problems with the anesthesia. Infection of the amniotic sac. BEFORE THE PROCEDURE  Do not take aspirin. Do not eat or drink anything 8 hours before the procedure. Do not smoke. If you are being admitted the same day as the procedure, arrive at the hospital at least 60 minutes before the surgery or as directed. During this time, you will sign the necessary forms and get prepared for the surgery. A waiting area is available for family and friends. PROCEDURE  You will be given an IV (intravenous) and medication to relax you. You will be given a spinal for anesthesia. A stitch will be placed in and around the cervix to tighten it and keep it closed. AFTER THE PROCEDURE  You will go to a recovery room where you and the baby are monitored. Once you are awake, stable, and taking fluids well, barring other problems, you will be allowed to go home You may get progesterone to prevent uterine contractions and stabilize cervical length. Have someone drive you home and stay with you for a day or two. You may be given medications to take when you go home. HOME CARE INSTRUCTIONS  Only take over-the-counter or prescriptions medicines for pain,  discomfort or fever as directed by your caregiver. Avoid physical activities and exercise until your caregiver says it is okay. Resume your usual diet. Do not douche. Do not have sexual intercourse for one week after procedure Keep your follow up surgical and prenatal appointments with your caregiver. SEEK MEDICAL CARE IF:  You have abnormal vaginal discharge. You develop a rash. You are having problems with your medications. You become lightheaded or feel faint. SEEK IMMEDIATE MEDICAL CARE IF:  You develop vaginal bleeding. You are leaking fluid or have a gush of fluid from the vagina. You develop a temperature of 102 F (38.9 C) or higher. You pass out. You have uterine contractions. You feel the baby is not moving as much as usual or cannot feel the baby move. Document Released: 06/18/2008 Document Revised: 09/28/2011 Document Reviewed: 06/18/2008 ExitCare Patient Information 2013 [employer].  

## 2018-04-26 NOTE — Progress Notes (Signed)
I have reviewed the chart and agree with nursing staff's documentation of this patient's encounter.  [place] Bing, MD 04/26/2018 10:17 AM

## 2018-04-27 ENCOUNTER — Encounter (HOSPITAL_COMMUNITY): Payer: Self-pay | Admitting: Obstetrics & Gynecology

## 2018-05-05 ENCOUNTER — Ambulatory Visit (INDEPENDENT_AMBULATORY_CARE_PROVIDER_SITE_OTHER): Payer: Medicaid Other | Admitting: Obstetrics and Gynecology

## 2018-05-05 ENCOUNTER — Encounter: Payer: Self-pay | Admitting: Obstetrics and Gynecology

## 2018-05-05 VITALS — BP 113/61 | HR 89 | Wt 153.0 lb

## 2018-05-05 DIAGNOSIS — O0991 Supervision of high risk pregnancy, unspecified, first trimester: Secondary | ICD-10-CM

## 2018-05-05 DIAGNOSIS — O3432 Maternal care for cervical incompetence, second trimester: Secondary | ICD-10-CM

## 2018-05-05 DIAGNOSIS — Z8751 Personal history of pre-term labor: Secondary | ICD-10-CM

## 2018-05-05 DIAGNOSIS — O09299 Supervision of pregnancy with other poor reproductive or obstetric history, unspecified trimester: Secondary | ICD-10-CM

## 2018-05-05 DIAGNOSIS — Z6791 Unspecified blood type, Rh negative: Secondary | ICD-10-CM

## 2018-05-05 DIAGNOSIS — Z3482 Encounter for supervision of other normal pregnancy, second trimester: Secondary | ICD-10-CM | POA: Diagnosis not present

## 2018-05-05 DIAGNOSIS — O26892 Other specified pregnancy related conditions, second trimester: Secondary | ICD-10-CM

## 2018-05-05 NOTE — Addendum Note (Signed)
Addended by: Henrietta Dine on: 05/05/2018 03:00 PM   Modules accepted: Orders

## 2018-05-05 NOTE — Patient Instructions (Signed)

## 2018-05-05 NOTE — Progress Notes (Signed)
Subjective:  Rhonda Gordon is a 27 y.o. G3P1101 at [redacted]w[redacted]d being seen today for ongoing prenatal care.  She is currently monitored for the following issues for this high-risk pregnancy and has History of preterm delivery; Supervision of high risk pregnancy, antepartum, first trimester; History of incompetent cervix, currently pregnant; and Rh negative status during pregnancy on their problem list.  Patient reports no complaints.  Contractions: Not present. Vag. Bleeding: None.  Movement: Absent. Denies leaking of fluid.   The following portions of the patient's history were reviewed and updated as appropriate: allergies, current medications, past family history, past medical history, past social history, past surgical history and problem list. Problem list updated.  Objective:   Vitals:   05/05/18 1342  BP: 113/61  Pulse: 89  Weight: 153 lb (69.4 kg)    Fetal Status: Fetal Heart Rate (bpm): 158   Movement: Absent     General:  Alert, oriented and cooperative. Patient is in no acute distress.  Skin: Skin is warm and dry. No rash noted.   Cardiovascular: Normal heart rate noted  Respiratory: Normal respiratory effort, no problems with respiration noted  Abdomen: Soft, gravid, appropriate for gestational age. Pain/Pressure: Present     Pelvic:  Cervical exam deferred        Extremities: Normal range of motion.  Edema: None  Mental Status: Normal mood and affect. Normal behavior. Normal judgment and thought content.   Urinalysis:      Assessment and Plan:  Pregnancy: G3P1101 at [redacted]w[redacted]d  1. Supervision of high risk pregnancy, antepartum, first trimester Stable Genetic testing today.  2. History of preterm delivery Declines 17 OHP.   3. History of incompetent cervix, currently pregnant Cerclage placed 04/25/18 Discussed 17 OHP vs Prometrium  Declines 17 OHP as noted above Undecided about Prometrium  4. Rh negative status during pregnancy in second trimester   5. Cervical  cerclage suture present in second trimester Cerclage in place - [place] MFM OB Transvaginal; Future  Preterm labor symptoms and general obstetric precautions including but not limited to vaginal bleeding, contractions, leaking of fluid and fetal movement were reviewed in detail with the patient. Please refer to After Visit Summary for other counseling recommendations.  No follow-ups on file.   Hermina Staggers, MD

## 2018-05-07 LAB — AFP, SERUM, OPEN SPINA BIFIDA
AFP MoM: 1.34
AFP VALUE AFPOSL: 42.3 ng/mL
Gest. Age on Collection Date: 15 weeks
Maternal Age At EDD: 27.1 yr
OSBR RISK 1 IN: 8546
Test Results:: NEGATIVE
Weight: 153 [lb_av]

## 2018-05-10 ENCOUNTER — Encounter: Payer: Self-pay | Admitting: *Deleted

## 2018-05-16 ENCOUNTER — Encounter: Payer: Self-pay | Admitting: *Deleted

## 2018-05-20 ENCOUNTER — Encounter (HOSPITAL_COMMUNITY): Payer: Self-pay

## 2018-05-27 ENCOUNTER — Encounter (HOSPITAL_COMMUNITY): Payer: Self-pay

## 2018-05-27 ENCOUNTER — Ambulatory Visit (HOSPITAL_COMMUNITY)
Admission: RE | Admit: 2018-05-27 | Discharge: 2018-05-27 | Disposition: A | Discharge status: Home / Self Care | Payer: Medicaid Other | Source: Ambulatory Visit | Attending: Obstetrics and Gynecology | Admitting: Obstetrics and Gynecology

## 2018-05-27 DIAGNOSIS — O0991 Supervision of high risk pregnancy, unspecified, first trimester: Secondary | ICD-10-CM | POA: Insufficient documentation

## 2018-05-27 DIAGNOSIS — Z3A19 19 weeks gestation of pregnancy: Secondary | ICD-10-CM | POA: Insufficient documentation

## 2018-05-27 DIAGNOSIS — O09212 Supervision of pregnancy with history of pre-term labor, second trimester: Secondary | ICD-10-CM

## 2018-05-27 DIAGNOSIS — Z363 Encounter for antenatal screening for malformations: Secondary | ICD-10-CM

## 2018-05-27 DIAGNOSIS — O3432 Maternal care for cervical incompetence, second trimester: Secondary | ICD-10-CM | POA: Diagnosis not present

## 2018-05-30 ENCOUNTER — Other Ambulatory Visit (HOSPITAL_COMMUNITY): Payer: Self-pay | Admitting: *Deleted

## 2018-05-30 DIAGNOSIS — O3432 Maternal care for cervical incompetence, second trimester: Secondary | ICD-10-CM

## 2018-05-30 DIAGNOSIS — Z362 Encounter for other antenatal screening follow-up: Secondary | ICD-10-CM

## 2018-06-02 ENCOUNTER — Encounter: Payer: Self-pay | Admitting: Family Medicine

## 2018-06-06 ENCOUNTER — Ambulatory Visit (INDEPENDENT_AMBULATORY_CARE_PROVIDER_SITE_OTHER): Payer: Medicaid Other | Admitting: Family Medicine

## 2018-06-06 VITALS — BP 115/68 | HR 102 | Wt 164.0 lb

## 2018-06-06 DIAGNOSIS — O09292 Supervision of pregnancy with other poor reproductive or obstetric history, second trimester: Secondary | ICD-10-CM

## 2018-06-06 DIAGNOSIS — O09299 Supervision of pregnancy with other poor reproductive or obstetric history, unspecified trimester: Secondary | ICD-10-CM

## 2018-06-06 DIAGNOSIS — O26892 Other specified pregnancy related conditions, second trimester: Secondary | ICD-10-CM

## 2018-06-06 DIAGNOSIS — Z8751 Personal history of pre-term labor: Secondary | ICD-10-CM

## 2018-06-06 DIAGNOSIS — O0992 Supervision of high risk pregnancy, unspecified, second trimester: Secondary | ICD-10-CM

## 2018-06-06 DIAGNOSIS — K047 Periapical abscess without sinus: Secondary | ICD-10-CM

## 2018-06-06 DIAGNOSIS — O0991 Supervision of high risk pregnancy, unspecified, first trimester: Secondary | ICD-10-CM

## 2018-06-06 DIAGNOSIS — Z3A20 20 weeks gestation of pregnancy: Secondary | ICD-10-CM

## 2018-06-06 DIAGNOSIS — Z6791 Unspecified blood type, Rh negative: Secondary | ICD-10-CM

## 2018-06-06 MED ORDER — AMOXICILLIN 875 MG PO TABS: 875.0000 mg | ORAL_TABLET | Freq: Two times a day (BID) | ORAL | 0 refills | Status: DC

## 2018-06-06 NOTE — Progress Notes (Signed)
   PRENATAL VISIT NOTE  Subjective:  Rhonda MorinMyleeka Gordon is a 27 y.o. G3P1101 at 5943w3d being seen today for ongoing prenatal care.  She is currently monitored for the following issues for this high-risk pregnancy and has History of preterm delivery; Supervision of high risk pregnancy, antepartum, first trimester; History of incompetent cervix, currently pregnant; and Rh negative status during pregnancy on their problem list.  Patient reports tooth ache. Had been prescribed PCN, but only took it when she had pain..  Contractions: Not present. Vag. Bleeding: None.  Movement: Present. Denies leaking of fluid.   The following portions of the patient's history were reviewed and updated as appropriate: allergies, current medications, past family history, past medical history, past social history, past surgical history and problem list. Problem list updated.  Objective:   Vitals:   06/06/18 1327  BP: 115/68  Pulse: (!) 102  Weight: 164 lb (74.4 kg)    Fetal Status: Fetal Heart Rate (bpm): 145 Fundal Height: 20 cm Movement: Present     General:  Alert, oriented and cooperative. Patient is in no acute distress.  Skin: Skin is warm and dry. No rash noted.   Cardiovascular: Normal heart rate noted  Respiratory: Normal respiratory effort, no problems with respiration noted  Abdomen: Soft, gravid, appropriate for gestational age.  Pain/Pressure: Present     Pelvic: Cervical exam deferred        Extremities: Normal range of motion.  Edema: None  Mental Status: Normal mood and affect. Normal behavior. Normal judgment and thought content.   Assessment and Plan:  Pregnancy: G3P1101 at 3943w3d  1. Supervision of high risk pregnancy, antepartum, first trimester FHT and FH normal  2. History of incompetent cervix, currently pregnant Cerclage present  3. History of preterm delivery Declines 17-OH progesterone  4. Rh negative status during pregnancy in second trimester Rhogam at 28 weeks  5. Dental  infection Amoxicillin. Dental referral.  Preterm labor symptoms and general obstetric precautions including but not limited to vaginal bleeding, contractions, leaking of fluid and fetal movement were reviewed in detail with the patient. Please refer to After Visit Summary for other counseling recommendations.  No follow-ups on file.  Future Appointments  Date Time Provider Department Center  06/24/2018  1:30 PM [facility] US 5 [facility]    Levie HeritageJacob J Stinson, DO

## 2018-06-22 ENCOUNTER — Encounter: Payer: Self-pay | Admitting: Family Medicine

## 2018-06-24 ENCOUNTER — Ambulatory Visit (HOSPITAL_COMMUNITY)
Admission: RE | Admit: 2018-06-24 | Discharge: 2018-06-24 | Disposition: A | Discharge status: Home / Self Care | Payer: Medicaid Other | Source: Ambulatory Visit | Attending: Family Medicine | Admitting: Family Medicine

## 2018-06-24 ENCOUNTER — Encounter (HOSPITAL_COMMUNITY): Payer: Self-pay

## 2018-06-24 DIAGNOSIS — Z362 Encounter for other antenatal screening follow-up: Secondary | ICD-10-CM | POA: Insufficient documentation

## 2018-06-24 DIAGNOSIS — Z3A23 23 weeks gestation of pregnancy: Secondary | ICD-10-CM | POA: Insufficient documentation

## 2018-06-24 DIAGNOSIS — O3432 Maternal care for cervical incompetence, second trimester: Secondary | ICD-10-CM

## 2018-06-24 DIAGNOSIS — O0991 Supervision of high risk pregnancy, unspecified, first trimester: Secondary | ICD-10-CM

## 2018-06-24 DIAGNOSIS — O09212 Supervision of pregnancy with history of pre-term labor, second trimester: Secondary | ICD-10-CM

## 2018-07-05 ENCOUNTER — Encounter: Payer: Self-pay | Admitting: Family Medicine

## 2018-07-05 ENCOUNTER — Telehealth: Payer: Self-pay

## 2018-07-05 ENCOUNTER — Ambulatory Visit (INDEPENDENT_AMBULATORY_CARE_PROVIDER_SITE_OTHER): Payer: Medicaid Other | Admitting: Advanced Practice Midwife

## 2018-07-05 VITALS — BP 112/73 | HR 100 | Wt 172.3 lb

## 2018-07-05 DIAGNOSIS — O26892 Other specified pregnancy related conditions, second trimester: Secondary | ICD-10-CM

## 2018-07-05 DIAGNOSIS — O26872 Cervical shortening, second trimester: Secondary | ICD-10-CM

## 2018-07-05 DIAGNOSIS — Z6791 Unspecified blood type, Rh negative: Secondary | ICD-10-CM

## 2018-07-05 DIAGNOSIS — O09299 Supervision of pregnancy with other poor reproductive or obstetric history, unspecified trimester: Secondary | ICD-10-CM

## 2018-07-05 DIAGNOSIS — O09292 Supervision of pregnancy with other poor reproductive or obstetric history, second trimester: Secondary | ICD-10-CM

## 2018-07-05 DIAGNOSIS — O0991 Supervision of high risk pregnancy, unspecified, first trimester: Secondary | ICD-10-CM

## 2018-07-05 DIAGNOSIS — O0992 Supervision of high risk pregnancy, unspecified, second trimester: Secondary | ICD-10-CM

## 2018-07-05 DIAGNOSIS — O3432 Maternal care for cervical incompetence, second trimester: Secondary | ICD-10-CM

## 2018-07-05 DIAGNOSIS — Z3A26 26 weeks gestation of pregnancy: Secondary | ICD-10-CM

## 2018-07-05 NOTE — Progress Notes (Signed)
error 

## 2018-07-05 NOTE — Patient Instructions (Signed)
TDaP Vaccine Pregnancy Get the Whooping Cough Vaccine While You Are Pregnant (CDC)  It is important for women to get the whooping cough vaccine in the third trimester of each pregnancy. Vaccines are the best way to prevent this disease. There are 2 different whooping cough vaccines. Both vaccines combine protection against whooping cough, tetanus and diphtheria, but they are for different age groups: Tdap: for everyone 11 years or older, including pregnant women  DTaP: for children 2 months through 6 years of age  You need the whooping cough vaccine during each of your pregnancies The recommended time to get the shot is during your 27th through 36th week of pregnancy, preferably during the earlier part of this time period. The Centers for Disease Control and Prevention (CDC) recommends that pregnant women receive the whooping cough vaccine for adolescents and adults (called Tdap vaccine) during the third trimester of each pregnancy. The recommended time to get the shot is during your 27th through 36th week of pregnancy, preferably during the earlier part of this time period. This replaces the original recommendation that pregnant women get the vaccine only if they had not previously received it. The American College of Obstetricians and Gynecologists and the American College of Nurse-Midwives support this recommendation.  You should get the whooping cough vaccine while pregnant to pass protection to your baby frame support disabled and/or not supported in this browser  Learn why Rhonda Gordon decided to get the whooping cough vaccine in her 3rd trimester of pregnancy and how her baby girl was born with some protection against the disease. Also available on YouTube. After receiving the whooping cough vaccine, your body will create protective antibodies (proteins produced by the body to fight off diseases) and pass some of them to your baby before birth. These antibodies provide your baby some short-term  protection against whooping cough in early life. These antibodies can also protect your baby from some of the more serious complications that come along with whooping cough. Your protective antibodies are at their highest about 2 weeks after getting the vaccine, but it takes time to pass them to your baby. So the preferred time to get the whooping cough vaccine is early in your third trimester. The amount of whooping cough antibodies in your body decreases over time. That is why CDC recommends you get a whooping cough vaccine during each pregnancy. Doing so allows each of your babies to get the greatest number of protective antibodies from you. This means each of your babies will get the best protection possible against this disease.  Getting the whooping cough vaccine while pregnant is better than getting the vaccine after you give birth Whooping cough vaccination during pregnancy is ideal so your baby will have short-term protection as soon as he is born. This early protection is important because your baby will not start getting his whooping cough vaccines until he is 2 months old. These first few months of life are when your baby is at greatest risk for catching whooping cough. This is also when he's at greatest risk for having severe, potentially life-threating complications from the infection. To avoid that gap in protection, it is best to get a whooping cough vaccine during pregnancy. You will then pass protection to your baby before he is born. To continue protecting your baby, he should get whooping cough vaccines starting at 2 months old. You may never have gotten the Tdap vaccine before and did not get it during this pregnancy. If so, you should make sure   to get the vaccine immediately after you give birth, before leaving the hospital or birthing center. It will take about 2 weeks before your body develops protection (antibodies) in response to the vaccine. Once you have protection from the vaccine,  you are less likely to give whooping cough to your newborn while caring for him. But remember, your baby will still be at risk for catching whooping cough from others. A recent study looked to see how effective Tdap was at preventing whooping cough in babies whose mothers got the vaccine while pregnant or in the hospital after giving birth. The study found that getting Tdap between 27 through 36 weeks of pregnancy is 85% more effective at preventing whooping cough in babies younger than 2 months old. Blood tests cannot tell if you need a whooping cough vaccine There are no blood tests that can tell you if you have enough antibodies in your body to protect yourself or your baby against whooping cough. Even if you have been sick with whooping cough in the past or previously received the vaccine, you still should get the vaccine during each pregnancy. Breastfeeding may pass some protective antibodies onto your baby By breastfeeding, you may pass some antibodies you have made in response to the vaccine to your baby. When you get a whooping cough vaccine during your pregnancy, you will have antibodies in your breast milk that you can share with your baby as soon as your milk comes in. However, your baby will not get protective antibodies immediately if you wait to get the whooping cough vaccine until after delivering your baby. This is because it takes about 2 weeks for your body to create antibodies. Learn more about the health benefits of breastfeeding.  

## 2018-07-05 NOTE — Telephone Encounter (Signed)
Clld pt to advise of US appt on 07/15/18@ 1pm, no answer, left message.

## 2018-07-05 NOTE — Progress Notes (Signed)
   PRENATAL VISIT NOTE  Subjective:  Rhonda Gordon is a 27 y.o. G3P1101 at 7879w4d being seen today for ongoing prenatal care.  She is currently monitored for the following issues for this high-risk pregnancy and has Supervision of high risk pregnancy, antepartum, first trimester; History of incompetent cervix, currently pregnant; and Rh negative status during pregnancy on their problem list.  Patient reports no complaints.  Contractions: Not present. Vag. Bleeding: None.  Movement: Present. Denies leaking of fluid.   The following portions of the patient's history were reviewed and updated as appropriate: allergies, current medications, past family history, past medical history, past social history, past surgical history and problem list. Problem list updated.  Objective:   Vitals:   07/05/18 1152  BP: 112/73  Pulse: 100  Weight: 172 lb 4.8 oz (78.2 kg)    Fetal Status: Fetal Heart Rate (bpm): 153   Movement: Present   Fundal ht 24 cm   General:  Alert, oriented and cooperative. Patient is in no acute distress.  Skin: Skin is warm and dry. No rash noted.   Cardiovascular: Normal heart rate noted  Respiratory: Normal respiratory effort, no problems with respiration noted  Abdomen: Soft, gravid, appropriate for gestational age.  Pain/Pressure: Absent     Pelvic: Cervical exam deferred        Extremities: Normal range of motion.  Edema: None  Mental Status: Normal mood and affect. Normal behavior. Normal judgment and thought content.   Assessment and Plan:  Pregnancy: G3P1101 at 6279w4d  1. Supervision of high risk pregnancy, antepartum, first trimester  - US MFM OB Transvaginal; Future  2. History of cervical incompetence in pregnancy, currently pregnant, second trimester  - US MFM OB Transvaginal; Future  3. Short cervix with cervical cerclage in second trimester, antepartum  - US MFM OB Transvaginal; Future  5. Rh negative status during pregnancy in second  trimester Rhophylac NV   Preterm labor symptoms and general obstetric precautions including but not limited to vaginal bleeding, contractions, leaking of fluid and fetal movement were reviewed in detail with the patient. Please refer to After Visit Summary for other counseling recommendations.  Return in about 4 weeks (around 08/02/2018) for ROB/GTT.  No future appointments.  Dorathy KinsmanVirginia Yonathan Perrow, CNM

## 2018-07-07 ENCOUNTER — Emergency Department (HOSPITAL_COMMUNITY)
Admission: EM | Admit: 2018-07-07 | Discharge: 2018-07-07 | Disposition: A | Discharge status: Home / Self Care | Payer: Medicaid Other | Attending: Emergency Medicine | Admitting: Emergency Medicine

## 2018-07-07 ENCOUNTER — Other Ambulatory Visit: Payer: Self-pay

## 2018-07-07 DIAGNOSIS — Z79899 Other long term (current) drug therapy: Secondary | ICD-10-CM | POA: Insufficient documentation

## 2018-07-07 DIAGNOSIS — M546 Pain in thoracic spine: Secondary | ICD-10-CM | POA: Diagnosis not present

## 2018-07-07 NOTE — Discharge Instructions (Addendum)
Please read attached information. If you experience any new or worsening signs or symptoms please return to the emergency room for evaluation. Please follow-up with your primary care provider or specialist as discussed.  °
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
## 2018-07-07 NOTE — ED Notes (Signed)
Pt verbalized understanding of discharge instructions and denies any further questions at this time.   

## 2018-07-15 ENCOUNTER — Ambulatory Visit (HOSPITAL_COMMUNITY)
Admission: RE | Admit: 2018-07-15 | Discharge: 2018-07-15 | Disposition: A | Discharge status: Home / Self Care | Payer: Medicaid Other | Source: Ambulatory Visit | Attending: Advanced Practice Midwife | Admitting: Advanced Practice Midwife

## 2018-07-15 ENCOUNTER — Encounter (HOSPITAL_COMMUNITY): Payer: Self-pay

## 2018-07-15 DIAGNOSIS — O09212 Supervision of pregnancy with history of pre-term labor, second trimester: Secondary | ICD-10-CM

## 2018-07-15 DIAGNOSIS — O26872 Cervical shortening, second trimester: Secondary | ICD-10-CM | POA: Diagnosis not present

## 2018-07-15 DIAGNOSIS — Z3A26 26 weeks gestation of pregnancy: Secondary | ICD-10-CM | POA: Diagnosis not present

## 2018-07-15 DIAGNOSIS — O0991 Supervision of high risk pregnancy, unspecified, first trimester: Secondary | ICD-10-CM

## 2018-07-15 DIAGNOSIS — O3432 Maternal care for cervical incompetence, second trimester: Secondary | ICD-10-CM | POA: Diagnosis not present

## 2018-07-15 DIAGNOSIS — O0992 Supervision of high risk pregnancy, unspecified, second trimester: Secondary | ICD-10-CM | POA: Diagnosis not present

## 2018-07-15 DIAGNOSIS — O09292 Supervision of pregnancy with other poor reproductive or obstetric history, second trimester: Secondary | ICD-10-CM | POA: Insufficient documentation

## 2018-07-20 NOTE — L&D Delivery Note (Signed)
OB/GYN Faculty Practice Delivery Note  Rhonda Gordon is a 28 y.o. G3P1101 s/p precipitous SVD at [redacted]w[redacted]d. She was complete and pushing on arrival to MAU.   ROM: 0h 61m with clear fluid GBS Status: negative Maximum Maternal Temperature: Temp (24hrs), Avg:98.7 F (37.1 C), Min:98.1 F (36.7 C), Max:99.4 F (37.4 C)  Labor Progress: . Arrived in MAU complete and pushing  Delivery Date/Time: 10/20/18 at 2308 Delivery: Called to room and patient was complete and pushing. Head delivered occiput anterior. Two nuchal cords present, reduced after delivery. Shoulder and body delivered in usual fashion. Infant with spontaneous cry, placed on mother's abdomen, dried and stimulated. Cord clamped x 2 after 1-minute delay, and cut by father of baby. Cord blood drawn. Placenta delivered spontaneously with gentle cord traction. Fundus firm with massage and Pitocin. Labia, perineum, vagina, and cervix inspected inspected with periurethral laceration.   Placenta: spontaneous, intact, 3-vessel cord (to be discarded) Complications: precipitous delivery Lacerations: periurethral repaired with 4-0 Monocryl EBL: 150cc Analgesia: local lidocaine  Postpartum Planning [x]  message to sent to schedule follow-up  [x]  vaccines UTD  Infant: Vigorous female  APGARs pending but vigorous with good tone - please see delivery summary   weight pending  Christiane Sistare S. Earlene Plater, DO OB/GYN Fellow, Faculty Practice

## 2018-08-01 ENCOUNTER — Other Ambulatory Visit: Payer: Self-pay | Admitting: *Deleted

## 2018-08-01 DIAGNOSIS — O0991 Supervision of high risk pregnancy, unspecified, first trimester: Secondary | ICD-10-CM

## 2018-08-03 ENCOUNTER — Ambulatory Visit (INDEPENDENT_AMBULATORY_CARE_PROVIDER_SITE_OTHER): Payer: Medicaid Other | Admitting: Obstetrics & Gynecology

## 2018-08-03 ENCOUNTER — Other Ambulatory Visit: Payer: Medicaid Other

## 2018-08-03 VITALS — BP 117/76 | HR 90 | Wt 179.0 lb

## 2018-08-03 DIAGNOSIS — Z23 Encounter for immunization: Secondary | ICD-10-CM

## 2018-08-03 DIAGNOSIS — A749 Chlamydial infection, unspecified: Secondary | ICD-10-CM | POA: Diagnosis not present

## 2018-08-03 DIAGNOSIS — O0991 Supervision of high risk pregnancy, unspecified, first trimester: Secondary | ICD-10-CM

## 2018-08-03 DIAGNOSIS — O09293 Supervision of pregnancy with other poor reproductive or obstetric history, third trimester: Secondary | ICD-10-CM | POA: Diagnosis not present

## 2018-08-03 DIAGNOSIS — O2600 Excessive weight gain in pregnancy, unspecified trimester: Secondary | ICD-10-CM | POA: Insufficient documentation

## 2018-08-03 DIAGNOSIS — O0993 Supervision of high risk pregnancy, unspecified, third trimester: Secondary | ICD-10-CM | POA: Diagnosis not present

## 2018-08-03 DIAGNOSIS — Z3A28 28 weeks gestation of pregnancy: Secondary | ICD-10-CM

## 2018-08-03 DIAGNOSIS — A599 Trichomoniasis, unspecified: Secondary | ICD-10-CM | POA: Diagnosis not present

## 2018-08-03 DIAGNOSIS — O98812 Other maternal infectious and parasitic diseases complicating pregnancy, second trimester: Secondary | ICD-10-CM

## 2018-08-03 DIAGNOSIS — O09299 Supervision of pregnancy with other poor reproductive or obstetric history, unspecified trimester: Secondary | ICD-10-CM

## 2018-08-03 DIAGNOSIS — O2603 Excessive weight gain in pregnancy, third trimester: Secondary | ICD-10-CM

## 2018-08-03 NOTE — Progress Notes (Signed)
   PRENATAL VISIT NOTE  Subjective:  Rhonda Gordon is a 28 y.o. G3P1101 at [redacted]w[redacted]d being seen today for ongoing prenatal care.  She is currently monitored for the following issues for this high-risk pregnancy and has Supervision of high risk pregnancy, antepartum, first trimester; History of incompetent cervix, currently pregnant; Rh negative status during pregnancy; Chlamydia infection affecting pregnancy in second trimester; Excessive weight gain affecting pregnancy; and Trichomoniasis on their problem list.  Patient reports no complaints.  Contractions: Not present. Vag. Bleeding: None.  Movement: Present. Denies leaking of fluid.   The following portions of the patient's history were reviewed and updated as appropriate: allergies, current medications, past family history, past medical history, past social history, past surgical history and problem list. Problem list updated.  Objective:   Vitals:   08/03/18 0853  BP: 117/76  Pulse: 90  Weight: 179 lb (81.2 kg)    Fetal Status: Fetal Heart Rate (bpm): 150   Movement: Present     General:  Alert, oriented and cooperative. Patient is in no acute distress.  Skin: Skin is warm and dry. No rash noted.   Cardiovascular: Normal heart rate noted  Respiratory: Normal respiratory effort, no problems with respiration noted  Abdomen: Soft, gravid, appropriate for gestational age.  Pain/Pressure: Absent     Pelvic: Cervical exam deferred        Extremities: Normal range of motion.  Edema: None  Mental Status: Normal mood and affect. Normal behavior. Normal judgment and thought content.   Assessment and Plan:  Pregnancy: G3P1101 at [redacted]w[redacted]d  1. Supervision of high risk pregnancy, antepartum, first trimester - 28 week labs - Tdap vaccine greater than or equal to 7yo IM - RHO (D) Immune Globulin  2. History of incompetent cervix, currently pregnant -cerclage in - MFM measuring cervical length  3. Excessive weight gain affecting  pregnancy  - Referral to Nutrition and Diabetes Services  4. Trichomoniasis -negative TOC  5. Chlamydia infection affecting pregnancy in second trimester - negative TOC  Preterm labor symptoms and general obstetric precautions including but not limited to vaginal bleeding, contractions, leaking of fluid and fetal movement were reviewed in detail with the patient. Please refer to After Visit Summary for other counseling recommendations.  No follow-ups on file.  No future appointments.  Allie Bossier, MD

## 2018-08-04 ENCOUNTER — Ambulatory Visit: Payer: Self-pay

## 2018-08-04 LAB — GLUCOSE TOLERANCE, 2 HOURS W/ 1HR
GLUCOSE, FASTING: 89 mg/dL (ref 65–91)
Glucose, 1 hour: 118 mg/dL (ref 65–179)
Glucose, 2 hour: 95 mg/dL (ref 65–152)

## 2018-08-04 LAB — CBC
Hematocrit: 33 % — ABNORMAL LOW (ref 34.0–46.6)
Hemoglobin: 11.2 g/dL (ref 11.1–15.9)
MCH: 32.2 pg (ref 26.6–33.0)
MCHC: 33.9 g/dL (ref 31.5–35.7)
MCV: 95 fL (ref 79–97)
PLATELETS: 243 10*3/uL (ref 150–450)
RBC: 3.48 x10E6/uL — AB (ref 3.77–5.28)
RDW: 12.6 % (ref 11.7–15.4)
WBC: 6.6 10*3/uL (ref 3.4–10.8)

## 2018-08-04 LAB — HIV ANTIBODY (ROUTINE TESTING W REFLEX): HIV Screen 4th Generation wRfx: NONREACTIVE

## 2018-08-04 LAB — ANTIBODY SCREEN: Antibody Screen: NEGATIVE

## 2018-08-04 LAB — RPR: RPR: NONREACTIVE

## 2018-08-08 ENCOUNTER — Ambulatory Visit (INDEPENDENT_AMBULATORY_CARE_PROVIDER_SITE_OTHER): Payer: Medicaid Other | Admitting: *Deleted

## 2018-08-08 VITALS — BP 134/75 | HR 91 | Wt 181.7 lb

## 2018-08-08 DIAGNOSIS — O26892 Other specified pregnancy related conditions, second trimester: Secondary | ICD-10-CM | POA: Diagnosis not present

## 2018-08-08 DIAGNOSIS — O0991 Supervision of high risk pregnancy, unspecified, first trimester: Secondary | ICD-10-CM

## 2018-08-08 DIAGNOSIS — Z6791 Unspecified blood type, Rh negative: Secondary | ICD-10-CM

## 2018-08-08 DIAGNOSIS — O360920 Maternal care for other rhesus isoimmunization, second trimester, not applicable or unspecified: Secondary | ICD-10-CM

## 2018-08-08 MED ORDER — RHO D IMMUNE GLOBULIN 1500 UNIT/2ML IJ SOSY
300.0000 ug | PREFILLED_SYRINGE | Freq: Once | INTRAMUSCULAR | Status: AC
  Administered 2018-08-08: 300 ug via INTRAMUSCULAR

## 2018-08-08 NOTE — Progress Notes (Signed)
Chart reviewed - agree with RN documentation.   

## 2018-08-08 NOTE — Progress Notes (Addendum)
Here for rhogam . Not in lobby when I went to call her back.  I called her by phone and left message we are here if she wishes to come back . 1:33pm. Patient back and rhogam given without complaint. Finis Hendricksen,RN

## 2018-08-18 ENCOUNTER — Encounter: Payer: Self-pay | Admitting: Obstetrics & Gynecology

## 2018-08-18 ENCOUNTER — Encounter: Payer: Self-pay | Admitting: Advanced Practice Midwife

## 2018-08-29 ENCOUNTER — Encounter: Payer: Self-pay | Admitting: Family Medicine

## 2018-08-29 ENCOUNTER — Ambulatory Visit (INDEPENDENT_AMBULATORY_CARE_PROVIDER_SITE_OTHER): Payer: Medicaid Other | Admitting: Family Medicine

## 2018-08-29 VITALS — BP 124/77 | HR 108 | Wt 185.0 lb

## 2018-08-29 DIAGNOSIS — O26892 Other specified pregnancy related conditions, second trimester: Secondary | ICD-10-CM

## 2018-08-29 DIAGNOSIS — O26893 Other specified pregnancy related conditions, third trimester: Secondary | ICD-10-CM

## 2018-08-29 DIAGNOSIS — O09293 Supervision of pregnancy with other poor reproductive or obstetric history, third trimester: Secondary | ICD-10-CM

## 2018-08-29 DIAGNOSIS — O0993 Supervision of high risk pregnancy, unspecified, third trimester: Secondary | ICD-10-CM

## 2018-08-29 DIAGNOSIS — O09299 Supervision of pregnancy with other poor reproductive or obstetric history, unspecified trimester: Secondary | ICD-10-CM

## 2018-08-29 DIAGNOSIS — O0991 Supervision of high risk pregnancy, unspecified, first trimester: Secondary | ICD-10-CM

## 2018-08-29 DIAGNOSIS — Z6791 Unspecified blood type, Rh negative: Secondary | ICD-10-CM

## 2018-08-29 DIAGNOSIS — O3433 Maternal care for cervical incompetence, third trimester: Secondary | ICD-10-CM

## 2018-08-29 DIAGNOSIS — O26872 Cervical shortening, second trimester: Secondary | ICD-10-CM

## 2018-08-29 DIAGNOSIS — O3432 Maternal care for cervical incompetence, second trimester: Secondary | ICD-10-CM

## 2018-08-29 DIAGNOSIS — O26873 Cervical shortening, third trimester: Secondary | ICD-10-CM

## 2018-08-29 NOTE — Progress Notes (Signed)
   PRENATAL VISIT NOTE  Subjective:  Rhonda Gordon is a 28 y.o. G3P1101 at [redacted]w[redacted]d being seen today for ongoing prenatal care.  She is currently monitored for the following issues for this high-risk pregnancy and has Supervision of high risk pregnancy, antepartum, first trimester; History of incompetent cervix, currently pregnant; Rh negative status during pregnancy; Chlamydia infection affecting pregnancy in second trimester; Excessive weight gain affecting pregnancy; and Trichomoniasis on their problem list.  Patient reports no complaints.  Contractions: Not present. Vag. Bleeding: None.  Movement: Present. Denies leaking of fluid.   The following portions of the patient's history were reviewed and updated as appropriate: allergies, current medications, past family history, past medical history, past social history, past surgical history and problem list. Problem list updated.  Objective:   Vitals:   08/29/18 1530  BP: 124/77  Pulse: (!) 108  Weight: 185 lb (83.9 kg)    Fetal Status: Fetal Heart Rate (bpm): 145 Fundal Height: 33 cm Movement: Present     General:  Alert, oriented and cooperative. Patient is in no acute distress.  Skin: Skin is warm and dry. No rash noted.   Cardiovascular: Normal heart rate noted  Respiratory: Normal respiratory effort, no problems with respiration noted  Abdomen: Soft, gravid, appropriate for gestational age.  Pain/Pressure: Present     Pelvic: Cervical exam deferred        Extremities: Normal range of motion.  Edema: None  Mental Status: Normal mood and affect. Normal behavior. Normal judgment and thought content.   Assessment and Plan:  Pregnancy: G3P1101 at [redacted]w[redacted]d  1. Supervision of high risk pregnancy, antepartum, first trimester FHT and FH   2. Rh negative status during pregnancy in second trimester Rhogam given last visit  3. History of incompetent cervix, currently pregnant Cerclage in place - remove at 36 weeks  4. Short cervix  with cervical cerclage in second trimester, antepartum   Preterm labor symptoms and general obstetric precautions including but not limited to vaginal bleeding, contractions, leaking of fluid and fetal movement were reviewed in detail with the patient. Please refer to After Visit Summary for other counseling recommendations.  No follow-ups on file.  No future appointments.  Levie Heritage, DO

## 2018-09-14 ENCOUNTER — Encounter: Payer: Self-pay | Admitting: Obstetrics & Gynecology

## 2018-09-18 ENCOUNTER — Encounter (HOSPITAL_COMMUNITY): Payer: Self-pay | Admitting: *Deleted

## 2018-09-18 ENCOUNTER — Inpatient Hospital Stay (HOSPITAL_COMMUNITY)
Admission: AD | Admit: 2018-09-18 | Discharge: 2018-09-18 | Disposition: A | Discharge status: Home / Self Care | Payer: Medicaid Other | Source: Ambulatory Visit | Attending: Obstetrics & Gynecology | Admitting: Obstetrics & Gynecology

## 2018-09-18 DIAGNOSIS — O26893 Other specified pregnancy related conditions, third trimester: Secondary | ICD-10-CM | POA: Diagnosis not present

## 2018-09-18 DIAGNOSIS — K219 Gastro-esophageal reflux disease without esophagitis: Secondary | ICD-10-CM | POA: Diagnosis not present

## 2018-09-18 DIAGNOSIS — Z3A35 35 weeks gestation of pregnancy: Secondary | ICD-10-CM | POA: Diagnosis not present

## 2018-09-18 DIAGNOSIS — O99613 Diseases of the digestive system complicating pregnancy, third trimester: Secondary | ICD-10-CM | POA: Diagnosis not present

## 2018-09-18 DIAGNOSIS — A084 Viral intestinal infection, unspecified: Secondary | ICD-10-CM | POA: Diagnosis not present

## 2018-09-18 DIAGNOSIS — O98513 Other viral diseases complicating pregnancy, third trimester: Secondary | ICD-10-CM

## 2018-09-18 DIAGNOSIS — O212 Late vomiting of pregnancy: Secondary | ICD-10-CM | POA: Diagnosis present

## 2018-09-18 LAB — URINALYSIS, ROUTINE W REFLEX MICROSCOPIC
Bilirubin Urine: NEGATIVE
Glucose, UA: NEGATIVE mg/dL
Hgb urine dipstick: NEGATIVE
Ketones, ur: NEGATIVE mg/dL
Nitrite: NEGATIVE
Protein, ur: NEGATIVE mg/dL
SPECIFIC GRAVITY, URINE: 1.008 (ref 1.005–1.030)
pH: 7 (ref 5.0–8.0)

## 2018-09-18 MED ORDER — ONDANSETRON 4 MG PO TBDP
8.0000 mg | ORAL_TABLET | Freq: Once | ORAL | Status: AC
  Administered 2018-09-18: 8 mg via ORAL
  Filled 2018-09-18: qty 2

## 2018-09-18 MED ORDER — ONDANSETRON 8 MG PO TBDP: 8.0000 mg | ORAL_TABLET | Freq: Three times a day (TID) | ORAL | 0 refills | Status: DC | PRN

## 2018-09-18 MED ORDER — ALUM & MAG HYDROXIDE-SIMETH 200-200-20 MG/5ML PO SUSP
30.0000 mL | Freq: Once | ORAL | Status: AC
  Administered 2018-09-18: 30 mL via ORAL
  Filled 2018-09-18: qty 30

## 2018-09-18 MED ORDER — FAMOTIDINE 20 MG PO TABS: 20.0000 mg | ORAL_TABLET | Freq: Two times a day (BID) | ORAL | 0 refills | Status: DC

## 2018-09-18 MED ORDER — LIDOCAINE VISCOUS HCL 2 % MT SOLN
15.0000 mL | Freq: Once | OROMUCOSAL | Status: AC
  Administered 2018-09-18: 15 mL via ORAL
  Filled 2018-09-18: qty 15

## 2018-09-18 NOTE — MAU Provider Note (Signed)
History     CSN: [id number]  Arrival date and time: 09/18/18 1705   First Provider Initiated Contact with Patient 09/18/18 1742      Chief Complaint  Patient presents with  . Abdominal Pain  . Emesis   Rhonda Gordon is a 28 y.o. G3P1101 at [redacted]w[redacted]d who presents today with nausea/vomiting/diarrhe and upper abdominal pain. She denies any contractions, VB or LOF. She reports normal fetal movement. Next OB visit: 09/21/18.  Abdominal Pain  This is a new problem. The current episode started today. The onset quality is sudden. The problem occurs intermittently. The problem has been unchanged. The pain is located in the epigastric region. The pain is at a severity of 7/10. The quality of the pain is sharp and burning. Associated symptoms include diarrhea (x2) and vomiting. Nothing aggravates the pain. The pain is relieved by nothing. She has tried nothing for the symptoms.  Emesis   This is a new problem. The current episode started today. The problem occurs 2 to 4 times per day. The problem has been unchanged. The emesis has an appearance of stomach contents. There has been no fever. Associated symptoms include abdominal pain and diarrhea (x2). Risk factors include ill contacts (daughter has had fever ).    OB History    Gravida  3   Para  2   Term  1   Preterm  1   AB  0   Living  1     SAB  0   TAB  0   Ectopic  0   Multiple  0   Live Births  1           Past Medical History:  Diagnosis Date  . Poor dental hygiene    cracked/cavities  . Pregnant   . Preterm delivery   . Preterm labor   . Prior pregnancy with fetal demise   . SVD (spontaneous vaginal delivery) 08/2013   svd - fetal loss at 27 wks    Past Surgical History:  Procedure Laterality Date  . CERVICAL CERCLAGE N/A 02/15/2014   Procedure: CERCLAGE CERVICAL;  Surgeon: Catalina Antigua, MD;  Location: [facility];  Service: Gynecology;  Laterality: N/A;  . CERVICAL CERCLAGE N/A 04/25/2018   Procedure: CERCLAGE  CERVICAL;  Surgeon: Tereso Newcomer, MD;  Location: [facility];  Service: Gynecology;  Laterality: N/A;  . TOOTH EXTRACTION      Family History  Problem Relation Age of Onset  . Hypertension Mother   . Hypertension Sister     Social History   Tobacco Use  . Smoking status: Never Smoker  . Smokeless tobacco: Never Used  Substance Use Topics  . Alcohol use: No  . Drug use: No    Allergies: No Known Allergies  Medications Prior to Admission  Medication Sig Dispense Refill Last Dose  . Prenatal Vit-Fe Fumarate-FA (PRENATAL MULTIVITAMIN) TABS tablet Take 1 tablet by mouth daily at 12 noon.   Taking    Review of Systems  Gastrointestinal: Positive for abdominal pain, diarrhea (x2) and vomiting.   Physical Exam   Blood pressure (!) 100/58, pulse (!) 133, temperature 98.2 F (36.8 C), temperature source Oral, resp. rate 18, height 5\' 6"  (1.676 m), weight 88 kg, last menstrual period 01/17/2018, SpO2 99 %, unknown if currently breastfeeding.  Physical Exam  Nursing note and vitals reviewed. Constitutional: She is oriented to person, place, and time. She appears well-developed and well-nourished. No distress.  HENT:  Head: Normocephalic.  Cardiovascular: Normal  rate.  Respiratory: Effort normal.  GI: Soft. There is no abdominal tenderness. There is no rebound.  Neurological: She is alert and oriented to person, place, and time.  Skin: Skin is warm and dry.  Psychiatric: She has a normal mood and affect.     NST:  Baseline: 145 Variability: moderate Accels: 15x15 Decels: none Toco: rare, with occasional UI. Not felt by the patient.   Results for orders placed or performed during the hospital encounter of 09/18/18 (from the past 24 hour(s))  Urinalysis, Routine w reflex microscopic     Status: Abnormal   Collection Time: 09/18/18  5:32 PM  Result Value Ref Range   Color, Urine YELLOW YELLOW   APPearance HAZY (A) CLEAR   Specific Gravity, Urine 1.008 1.005 -  1.030   pH 7.0 5.0 - 8.0   Glucose, UA NEGATIVE NEGATIVE mg/dL   Hgb urine dipstick NEGATIVE NEGATIVE   Bilirubin Urine NEGATIVE NEGATIVE   Ketones, ur NEGATIVE NEGATIVE mg/dL   Protein, ur NEGATIVE NEGATIVE mg/dL   Nitrite NEGATIVE NEGATIVE   Leukocytes,Ua TRACE (A) NEGATIVE   RBC / HPF 0-5 0 - 5 RBC/hpf   WBC, UA 0-5 0 - 5 WBC/hpf   Bacteria, UA FEW (A) NONE SEEN   Squamous Epithelial / LPF 11-20 0 - 5   Mucus PRESENT     MAU Course  Procedures  MDM 6:57 PM patient has had zofran and GI cocktail. She reports that her pain is now 0/10 and she is tolerating PO.   Assessment and Plan   1. Viral gastroenteritis   2. Gastroesophageal reflux disease, esophagitis presence not specified   3. [redacted] weeks gestation of pregnancy    DC home Comfort measures reviewed  3rd Trimester precautions  PTL precautions  Fetal kick counts RX: zofran PRN, Pepcid BID  Return to MAU as needed FU with OB as planned  Follow-up Information    [facility] Follow up.   Specialty:  Obstetrics and Gynecology Contact information: [address] [place] [address] [phone]         Thressa Sheller DNP, CNM  09/18/18  7:01 PM

## 2018-09-18 NOTE — MAU Note (Signed)
Pt states she has a cough, has vomited 3 times, began having chest (epigastric) pain after vomiting. Pain is intermittent.  Denies contractions, bleeding or LOF.  Reports good fetal movement since arrival to hospital.

## 2018-09-18 NOTE — Discharge Instructions (Signed)
Viral Gastroenteritis, Adult    Viral gastroenteritis is also known as the stomach flu. This condition is caused by certain germs (viruses). These germs can be passed from person to person very easily (are very contagious). This condition can cause sudden watery poop (diarrhea), fever, and throwing up (vomiting).  Having watery poop and throwing up can make you feel weak and cause you to get dehydrated. Dehydration can make you tired and thirsty, make you have a dry mouth, and make it so you pee (urinate) less often. Older adults and people with other diseases or a weak defense system (immune system) are at higher risk for dehydration. It is important to replace the fluids that you lose from having watery poop and throwing up.  Follow these instructions at home:  Follow instructions from your doctor about how to care for yourself at home.  Eating and drinking  Follow these instructions as told by your doctor:   Take an oral rehydration solution (ORS). This is a drink that is sold at pharmacies and stores.   Drink clear fluids in small amounts as you are able, such as:  ? Water.  ? Ice chips.  ? Diluted fruit juice.  ? Low-calorie sports drinks.   Eat bland, easy-to-digest foods in small amounts as you are able, such as:  ? Bananas.  ? Applesauce.  ? Rice.  ? Low-fat (lean) meats.  ? Toast.  ? Crackers.   Avoid fluids that have a lot of sugar or caffeine in them.   Avoid alcohol.   Avoid spicy or fatty foods.  General instructions     Drink enough fluid to keep your pee (urine) clear or pale yellow.   Wash your hands often. If you cannot use soap and water, use hand sanitizer.   Make sure that all people in your home wash their hands well and often.   Rest at home while you get better.   Take over-the-counter and prescription medicines only as told by your doctor.   Watch your condition for any changes.   Take a warm bath to help with any burning or pain from having watery poop.   Keep all follow-up  visits as told by your doctor. This is important.  Contact a doctor if:   You cannot keep fluids down.   Your symptoms get worse.   You have new symptoms.   You feel light-headed or dizzy.   You have muscle cramps.  Get help right away if:   You have chest pain.   You feel very weak or you pass out (faint).   You see blood in your throw-up.   Your throw-up looks like coffee grounds.   You have bloody or black poop (stools) or poop that look like tar.   You have a very bad headache, a stiff neck, or both.   You have a rash.   You have very bad pain, cramping, or bloating in your belly (abdomen).   You have trouble breathing.   You are breathing very quickly.   Your heart is beating very quickly.   Your skin feels cold and clammy.   You feel confused.   You have pain when you pee.   You have signs of dehydration, such as:  ? Dark pee, hardly any pee, or no pee.  ? Cracked lips.  ? Dry mouth.  ? Sunken eyes.  ? Sleepiness.  ? Weakness.  This information is not intended to replace advice given to you by your   health care provider. Make sure you discuss any questions you have with your health care provider.  Document Released: 12/23/2007 Document Revised: 03/30/2018 Document Reviewed: 03/12/2015  Elsevier Interactive Patient Education  2019 [employer]

## 2018-09-21 ENCOUNTER — Other Ambulatory Visit (HOSPITAL_COMMUNITY)
Admission: RE | Admit: 2018-09-21 | Discharge: 2018-09-21 | Disposition: A | Discharge status: Home / Self Care | Payer: Medicaid Other | Source: Ambulatory Visit | Attending: Obstetrics & Gynecology | Admitting: Obstetrics & Gynecology

## 2018-09-21 ENCOUNTER — Ambulatory Visit (INDEPENDENT_AMBULATORY_CARE_PROVIDER_SITE_OTHER): Payer: Medicaid Other | Admitting: Obstetrics & Gynecology

## 2018-09-21 VITALS — BP 116/73 | HR 104 | Wt 190.4 lb

## 2018-09-21 DIAGNOSIS — O09293 Supervision of pregnancy with other poor reproductive or obstetric history, third trimester: Secondary | ICD-10-CM

## 2018-09-21 DIAGNOSIS — Z3A35 35 weeks gestation of pregnancy: Secondary | ICD-10-CM

## 2018-09-21 DIAGNOSIS — O0991 Supervision of high risk pregnancy, unspecified, first trimester: Secondary | ICD-10-CM | POA: Insufficient documentation

## 2018-09-21 DIAGNOSIS — O09299 Supervision of pregnancy with other poor reproductive or obstetric history, unspecified trimester: Secondary | ICD-10-CM

## 2018-09-21 NOTE — Progress Notes (Signed)
   PRENATAL VISIT NOTE  Subjective:  Rhonda Gordon is a 28 y.o. G3P1101 at [redacted]w[redacted]d being seen today for ongoing prenatal care.  She is currently monitored for the following issues for this high-risk pregnancy and has Supervision of high risk pregnancy, antepartum, first trimester; History of incompetent cervix, currently pregnant; Rh negative status during pregnancy; Chlamydia infection affecting pregnancy in second trimester; Excessive weight gain affecting pregnancy; and Trichomoniasis on their problem list.  Patient reports no complaints.  Contractions: Not present. Vag. Bleeding: None.  Movement: Present. Denies leaking of fluid.   The following portions of the patient's history were reviewed and updated as appropriate: allergies, current medications, past family history, past medical history, past social history, past surgical history and problem list. Problem list updated.  Objective:   Vitals:   09/21/18 1140  BP: 116/73  Pulse: (!) 104  Weight: 190 lb 6.4 oz (86.4 kg)    Fetal Status: Fetal Heart Rate (bpm): 143   Movement: Present     General:  Alert, oriented and cooperative. Patient is in no acute distress.  Skin: Skin is warm and dry. No rash noted.   Cardiovascular: Normal heart rate noted  Respiratory: Normal respiratory effort, no problems with respiration noted  Abdomen: Soft, gravid, appropriate for gestational age.  Pain/Pressure: Absent     Pelvic: Cervical exam performed        Extremities: Normal range of motion.  Edema: None  Mental Status: Normal mood and affect. Normal behavior. Normal judgment and thought content.   Assessment and Plan:  Pregnancy: G3P1101 at [redacted]w[redacted]d  1. Supervision of high risk pregnancy, antepartum, first trimester  - Cervicovaginal ancillary only( [place]) - Culture, beta strep (group b only)  2. History of incompetent cervix, currently pregnant - cerclage removal at next visit  Preterm labor symptoms and general obstetric  precautions including but not limited to vaginal bleeding, contractions, leaking of fluid and fetal movement were reviewed in detail with the patient. Please refer to After Visit Summary for other counseling recommendations.  No follow-ups on file.  No future appointments.  Allie Bossier, MD

## 2018-09-22 LAB — CERVICOVAGINAL ANCILLARY ONLY
Bacterial vaginitis: NEGATIVE
Candida vaginitis: POSITIVE — AB
Chlamydia: NEGATIVE
Neisseria Gonorrhea: NEGATIVE
Trichomonas: NEGATIVE

## 2018-09-23 ENCOUNTER — Other Ambulatory Visit: Payer: Self-pay | Admitting: Obstetrics & Gynecology

## 2018-09-23 MED ORDER — FLUCONAZOLE 150 MG PO TABS: 150.0000 mg | ORAL_TABLET | Freq: Once | ORAL | 3 refills | Status: AC

## 2018-09-23 NOTE — Progress Notes (Signed)
Diflucan prescribed, yeast seen on pap

## 2018-09-25 LAB — CULTURE, BETA STREP (GROUP B ONLY): Strep Gp B Culture: NEGATIVE

## 2018-09-27 ENCOUNTER — Encounter: Payer: Medicaid Other | Admitting: Internal Medicine

## 2018-09-28 ENCOUNTER — Other Ambulatory Visit: Payer: Self-pay | Admitting: Obstetrics & Gynecology

## 2018-09-28 ENCOUNTER — Ambulatory Visit (INDEPENDENT_AMBULATORY_CARE_PROVIDER_SITE_OTHER): Payer: Medicaid Other | Admitting: Obstetrics and Gynecology

## 2018-09-28 ENCOUNTER — Other Ambulatory Visit: Payer: Self-pay

## 2018-09-28 DIAGNOSIS — O09293 Supervision of pregnancy with other poor reproductive or obstetric history, third trimester: Secondary | ICD-10-CM

## 2018-09-28 DIAGNOSIS — O09299 Supervision of pregnancy with other poor reproductive or obstetric history, unspecified trimester: Secondary | ICD-10-CM

## 2018-09-28 DIAGNOSIS — A599 Trichomoniasis, unspecified: Secondary | ICD-10-CM

## 2018-09-28 DIAGNOSIS — Z3A36 36 weeks gestation of pregnancy: Secondary | ICD-10-CM

## 2018-09-28 NOTE — Progress Notes (Unsigned)
Diflucan for yeast vaginitis

## 2018-09-28 NOTE — Procedures (Signed)
Cerclage Removal Note  Patient placed in dorsal lithotomy position. Op note specifiec McDonald with prolene suture with stitch at 12 o'clock. Leopolds cephalic  EGBUS, vault normal and cervix visually closed. Blue stitches noted at 1 o'clock. Stitch grasped with kelly clamp and tail cut and suture removed and approximately 5cm in length. No bleeding noted. SVE 1-1.5/long/thick  Cornelia Copa MD Attending Center for [employer] (Faculty Practice) 09/28/2018 Time: 1134am

## 2018-10-06 ENCOUNTER — Other Ambulatory Visit: Payer: Self-pay

## 2018-10-06 ENCOUNTER — Ambulatory Visit (INDEPENDENT_AMBULATORY_CARE_PROVIDER_SITE_OTHER): Payer: Medicaid Other | Admitting: Family Medicine

## 2018-10-06 VITALS — BP 126/82 | HR 92 | Temp 98.3°F | Wt 193.8 lb

## 2018-10-06 DIAGNOSIS — O0991 Supervision of high risk pregnancy, unspecified, first trimester: Secondary | ICD-10-CM

## 2018-10-06 DIAGNOSIS — O09299 Supervision of pregnancy with other poor reproductive or obstetric history, unspecified trimester: Secondary | ICD-10-CM

## 2018-10-06 DIAGNOSIS — Z3A37 37 weeks gestation of pregnancy: Secondary | ICD-10-CM

## 2018-10-06 DIAGNOSIS — O09293 Supervision of pregnancy with other poor reproductive or obstetric history, third trimester: Secondary | ICD-10-CM

## 2018-10-06 NOTE — Patient Instructions (Signed)

## 2018-10-06 NOTE — Progress Notes (Signed)
   PRENATAL VISIT NOTE  Subjective:  Rhonda Gordon is a 28 y.o. G3P1101 at [redacted]w[redacted]d being seen today for ongoing prenatal care.  She is currently monitored for the following issues for this high-risk pregnancy and has Supervision of high risk pregnancy, antepartum, first trimester; History of incompetent cervix, currently pregnant; Rh negative status during pregnancy; Chlamydia infection affecting pregnancy in second trimester; Excessive weight gain affecting pregnancy; and Trichomoniasis on their problem list.  Patient reports no complaints.  Contractions: Irritability. Vag. Bleeding: None.  Movement: Present. Denies leaking of fluid.   The following portions of the patient's history were reviewed and updated as appropriate: allergies, current medications, past family history, past medical history, past social history, past surgical history and problem list.   Objective:   Vitals:   10/06/18 1458  BP: 126/82  Pulse: 92  Temp: 98.3 F (36.8 C)  Weight: 193 lb 12.8 oz (87.9 kg)    Fetal Status: Fetal Heart Rate (bpm): 160 Fundal Height: 38 cm Movement: Present  Presentation: Vertex  General:  Alert, oriented and cooperative. Patient is in no acute distress.  Skin: Skin is warm and dry. No rash noted.   Cardiovascular: Normal heart rate noted  Respiratory: Normal respiratory effort, no problems with respiration noted  Abdomen: Soft, gravid, appropriate for gestational age.  Pain/Pressure: Present     Pelvic: Cervical exam performed Dilation: 2 Effacement (%): 80 Station: -2  Extremities: Normal range of motion.  Edema: None  Mental Status: Normal mood and affect. Normal behavior. Normal judgment and thought content.   Assessment and Plan:  Pregnancy: G3P1101 at [redacted]w[redacted]d 1. Supervision of high risk pregnancy, antepartum, first trimester Continue  prenatal care.   2. History of incompetent cervix, currently pregnant S/p cerclage with removal last week  Preterm labor symptoms and  general obstetric precautions including but not limited to vaginal bleeding, contractions, leaking of fluid and fetal movement were reviewed in detail with the patient. Please refer to After Visit Summary for other counseling recommendations.   Return in 1 week (on 10/13/2018).  Future Appointments  Date Time Provider Department Center  10/12/2018  4:15 PM Reva Bores, MD [facility]  10/20/2018  4:15 PM Reva Bores, MD [facility]    Reva Bores, MD

## 2018-10-12 ENCOUNTER — Encounter: Payer: Self-pay | Admitting: Family Medicine

## 2018-10-14 ENCOUNTER — Encounter: Payer: Self-pay | Admitting: *Deleted

## 2018-10-17 ENCOUNTER — Encounter: Payer: Self-pay | Admitting: Obstetrics and Gynecology

## 2018-10-20 ENCOUNTER — Inpatient Hospital Stay (HOSPITAL_COMMUNITY)
Admission: AD | Admit: 2018-10-20 | Discharge: 2018-10-22 | DRG: 807 | Disposition: A | Discharge status: Home / Self Care | Payer: Medicaid Other | Attending: Obstetrics and Gynecology | Admitting: Obstetrics and Gynecology

## 2018-10-20 ENCOUNTER — Ambulatory Visit (INDEPENDENT_AMBULATORY_CARE_PROVIDER_SITE_OTHER): Payer: Medicaid Other | Admitting: Family Medicine

## 2018-10-20 ENCOUNTER — Other Ambulatory Visit: Payer: Self-pay

## 2018-10-20 ENCOUNTER — Encounter (HOSPITAL_COMMUNITY): Payer: Self-pay

## 2018-10-20 VITALS — BP 128/78 | HR 99 | Temp 98.1°F | Wt 201.5 lb

## 2018-10-20 DIAGNOSIS — Z6791 Unspecified blood type, Rh negative: Secondary | ICD-10-CM

## 2018-10-20 DIAGNOSIS — O09293 Supervision of pregnancy with other poor reproductive or obstetric history, third trimester: Secondary | ICD-10-CM

## 2018-10-20 DIAGNOSIS — O09299 Supervision of pregnancy with other poor reproductive or obstetric history, unspecified trimester: Secondary | ICD-10-CM

## 2018-10-20 DIAGNOSIS — Z3A36 36 weeks gestation of pregnancy: Secondary | ICD-10-CM

## 2018-10-20 DIAGNOSIS — Z9889 Other specified postprocedural states: Secondary | ICD-10-CM

## 2018-10-20 DIAGNOSIS — O0991 Supervision of high risk pregnancy, unspecified, first trimester: Secondary | ICD-10-CM

## 2018-10-20 DIAGNOSIS — O26899 Other specified pregnancy related conditions, unspecified trimester: Secondary | ICD-10-CM

## 2018-10-20 DIAGNOSIS — O0993 Supervision of high risk pregnancy, unspecified, third trimester: Secondary | ICD-10-CM

## 2018-10-20 MED ORDER — OXYTOCIN 10 UNIT/ML IJ SOLN
INTRAMUSCULAR | Status: AC
  Administered 2018-10-20: 10 [IU] via INTRAMUSCULAR
  Filled 2018-10-20: qty 1

## 2018-10-20 MED ORDER — LIDOCAINE HCL (PF) 1 % IJ SOLN
INTRAMUSCULAR | Status: AC
  Filled 2018-10-20: qty 30

## 2018-10-20 MED ORDER — OXYTOCIN 10 UNIT/ML IJ SOLN
10.0000 [IU] | Freq: Once | INTRAMUSCULAR | Status: AC
  Administered 2018-10-20: 10 [IU] via INTRAMUSCULAR

## 2018-10-20 NOTE — MAU Note (Signed)
CTX 3 minutes apart.  States she feels "wet" but no large gush of fluid.  States she's also having some spotting since her Dr's appointment.  Hasn't felt baby move since before her appointment.  Had her cerclage removed on 3/11.  2.5 cm in the office today.

## 2018-10-20 NOTE — Progress Notes (Signed)
   PRENATAL VISIT NOTE  Subjective:  Rhonda Gordon is a 28 y.o. G3P1101 at [redacted]w[redacted]d being seen today for ongoing prenatal care.  She is currently monitored for the following issues for this high-risk pregnancy and has Supervision of high risk pregnancy, antepartum, first trimester; History of incompetent cervix, currently pregnant; Rh negative status during pregnancy; Chlamydia infection affecting pregnancy in second trimester; Excessive weight gain affecting pregnancy; and Trichomoniasis on their problem list.  Patient reports no complaints.  Contractions: Irregular. Vag. Bleeding: None.  Movement: Present. Denies leaking of fluid.   The following portions of the patient's history were reviewed and updated as appropriate: allergies, current medications, past family history, past medical history, past social history, past surgical history and problem list.   Objective:   Vitals:   10/20/18 1641  BP: 128/78  Pulse: 99  Temp: 98.1 F (36.7 C)  Weight: 201 lb 8 oz (91.4 kg)    Fetal Status: Fetal Heart Rate (bpm): 149 Fundal Height: 40 cm Movement: Present  Presentation: Vertex  General:  Alert, oriented and cooperative. Patient is in no acute distress.  Skin: Skin is warm and dry. No rash noted.   Cardiovascular: Normal heart rate noted  Respiratory: Normal respiratory effort, no problems with respiration noted  Abdomen: Soft, gravid, appropriate for gestational age.  Pain/Pressure: Present     Pelvic: Cervical exam performed Dilation: 2.5 Effacement (%): 80 Station: -2  Extremities: Normal range of motion.  Edema: None  Mental Status: Normal mood and affect. Normal behavior. Normal judgment and thought content.   Assessment and Plan:  Pregnancy: G3P1101 at [redacted]w[redacted]d 1. Supervision of high risk pregnancy, antepartum, first trimester Continue routine prenatal care. Post dates IOL scheduled at 41 wks Membranes stripped today  2. History of incompetent cervix, currently pregnant S/p  cerclage and removal  Term labor symptoms and general obstetric precautions including but not limited to vaginal bleeding, contractions, leaking of fluid and fetal movement were reviewed in detail with the patient. Please refer to After Visit Summary for other counseling recommendations.   Return in 6 weeks (on 12/01/2018) for video--pp check.  Future Appointments  Date Time Provider Department Center  10/28/2018  7:30 AM [facility] SCHED ROOM [facility] None    Reva Bores, MD

## 2018-10-20 NOTE — Patient Instructions (Signed)

## 2018-10-21 ENCOUNTER — Encounter (HOSPITAL_COMMUNITY): Payer: Self-pay

## 2018-10-21 DIAGNOSIS — Z3A40 40 weeks gestation of pregnancy: Secondary | ICD-10-CM

## 2018-10-21 DIAGNOSIS — O26893 Other specified pregnancy related conditions, third trimester: Secondary | ICD-10-CM | POA: Diagnosis present

## 2018-10-21 DIAGNOSIS — Z9889 Other specified postprocedural states: Secondary | ICD-10-CM

## 2018-10-21 DIAGNOSIS — Z6791 Unspecified blood type, Rh negative: Secondary | ICD-10-CM | POA: Diagnosis not present

## 2018-10-21 LAB — CBC
HCT: 31.1 % — ABNORMAL LOW (ref 36.0–46.0)
Hemoglobin: 10.5 g/dL — ABNORMAL LOW (ref 12.0–15.0)
MCH: 29.8 pg (ref 26.0–34.0)
MCHC: 33.8 g/dL (ref 30.0–36.0)
MCV: 88.4 fL (ref 80.0–100.0)
Platelets: 212 10*3/uL (ref 150–400)
RBC: 3.52 MIL/uL — ABNORMAL LOW (ref 3.87–5.11)
RDW: 13.7 % (ref 11.5–15.5)
WBC: 10 10*3/uL (ref 4.0–10.5)
nRBC: 0 % (ref 0.0–0.2)

## 2018-10-21 MED ORDER — PRENATAL MULTIVITAMIN CH
1.0000 | ORAL_TABLET | Freq: Every day | ORAL | Status: DC
  Administered 2018-10-21 – 2018-10-22 (×2): 1 via ORAL
  Filled 2018-10-21 (×2): qty 1

## 2018-10-21 MED ORDER — BENZOCAINE-MENTHOL 20-0.5 % EX AERO
1.0000 "application " | INHALATION_SPRAY | CUTANEOUS | Status: DC | PRN
  Administered 2018-10-21: 1 via TOPICAL
  Filled 2018-10-21 (×2): qty 56

## 2018-10-21 MED ORDER — DIBUCAINE 1 % RE OINT: 1.0000 "application " | TOPICAL_OINTMENT | RECTAL | Status: DC | PRN

## 2018-10-21 MED ORDER — DIPHENHYDRAMINE HCL 25 MG PO CAPS: 25.0000 mg | ORAL_CAPSULE | Freq: Four times a day (QID) | ORAL | Status: DC | PRN

## 2018-10-21 MED ORDER — ONDANSETRON HCL 4 MG/2ML IJ SOLN: 4.0000 mg | INTRAMUSCULAR | Status: DC | PRN

## 2018-10-21 MED ORDER — ONDANSETRON HCL 4 MG PO TABS: 4.0000 mg | ORAL_TABLET | ORAL | Status: DC | PRN

## 2018-10-21 MED ORDER — SIMETHICONE 80 MG PO CHEW: 80.0000 mg | CHEWABLE_TABLET | ORAL | Status: DC | PRN

## 2018-10-21 MED ORDER — WITCH HAZEL-GLYCERIN EX PADS: 1.0000 "application " | MEDICATED_PAD | CUTANEOUS | Status: DC | PRN

## 2018-10-21 MED ORDER — IBUPROFEN 600 MG PO TABS
600.0000 mg | ORAL_TABLET | Freq: Four times a day (QID) | ORAL | Status: DC
  Administered 2018-10-21 – 2018-10-22 (×7): 600 mg via ORAL
  Filled 2018-10-21 (×7): qty 1

## 2018-10-21 MED ORDER — TETANUS-DIPHTH-ACELL PERTUSSIS 5-2.5-18.5 LF-MCG/0.5 IM SUSP: 0.5000 mL | Freq: Once | INTRAMUSCULAR | Status: DC

## 2018-10-21 MED ORDER — MEASLES, MUMPS & RUBELLA VAC IJ SOLR: 0.5000 mL | Freq: Once | INTRAMUSCULAR | Status: DC

## 2018-10-21 MED ORDER — COCONUT OIL OIL: 1.0000 "application " | TOPICAL_OIL | Status: DC | PRN

## 2018-10-21 MED ORDER — ZOLPIDEM TARTRATE 5 MG PO TABS: 5.0000 mg | ORAL_TABLET | Freq: Every evening | ORAL | Status: DC | PRN

## 2018-10-21 MED ORDER — ACETAMINOPHEN 325 MG PO TABS
650.0000 mg | ORAL_TABLET | ORAL | Status: DC | PRN
  Administered 2018-10-21 (×2): 650 mg via ORAL
  Filled 2018-10-21 (×2): qty 2

## 2018-10-21 MED ORDER — RHO D IMMUNE GLOBULIN 1500 UNIT/2ML IJ SOSY
300.0000 ug | PREFILLED_SYRINGE | Freq: Once | INTRAMUSCULAR | Status: AC
  Administered 2018-10-21: 300 ug via INTRAMUSCULAR
  Filled 2018-10-21: qty 2

## 2018-10-21 MED ORDER — SENNOSIDES-DOCUSATE SODIUM 8.6-50 MG PO TABS
2.0000 | ORAL_TABLET | ORAL | Status: DC
  Administered 2018-10-21: 2 via ORAL
  Filled 2018-10-21: qty 2

## 2018-10-21 NOTE — Lactation Note (Signed)
This note was copied from a baby's chart. Lactation Consultation Note  Patient Name: Rhonda Gordon [id number] Date: 10/21/2018 Reason for consult: Initial assessment;1st time breastfeeding;Term P2, 5 hour female infant. Infant had two stools since delivery. This is mom's first time breastfeeding. Per mom, she has been experiencing pain with breastfeeding. Mom taught hand expression and infant was given 1 ml of colostrum by spoon. Mom latched infant to right breast using cross cradle hold, LC asked mom to wait until infant mouth is wide with tongue down, nose to breast, infant had deep latch. Infant was still breastfeeding (10 minutes) when LC left the room. Per mom, she is not experiencing any pain with current latch , "it doesn't hurt like before". LC discussed that when infant is coming off breast or if mom breaks the latch with her finger her nipple should be well rounded and not pinched. Mom knows to breastfeed according hunger cues, 8 or more times within 24 hours. LC discussed I & O. Mom knows to breastfeed according hunger cues, 8 or more times within 24 hours. Reviewed Baby & Me book's Breastfeeding Basics.  Mom knows to call Nurse or LC if she has any questions, concerns or need assistance with latching infant to breast. Mom made aware of O/P services, breastfeeding support groups, community resources, and our phone # for post-discharge questions.  Maternal Data Formula Feeding for Exclusion: Yes Reason for exclusion: Mother's choice to formula and breast feed on admission Has patient been taught Hand Expression?: Yes(1 ml of colostrum by spoon.) Does the patient have breastfeeding experience prior to this delivery?: No  Feeding Feeding Type: Breast Fed  LATCH Score Latch: Grasps breast easily, tongue down, lips flanged, rhythmical sucking.  Audible Swallowing: Spontaneous and intermittent  Type of Nipple: Everted at rest and after stimulation  Comfort (Breast/Nipple):  Soft / non-tender  Hold (Positioning): Assistance needed to correctly position infant at breast and maintain latch.  LATCH Score: 9  Interventions Interventions: Breast feeding basics reviewed;Assisted with latch;Skin to skin;Breast compression;Breast massage;Adjust position;Hand express;Support pillows;Position options  Lactation Tools Discussed/Used WIC Program: No   Consult Status Consult Status: Follow-up Date: 10/22/18 Follow-up type: In-patient    Danelle Earthly 10/21/2018, 4:50 AM

## 2018-10-21 NOTE — H&P (Signed)
OBSTETRIC ADMISSION HISTORY AND PHYSICAL  Rhonda Gordon is a 28 y.o. female G3P1101 with IUP at [redacted]w[redacted]d by L/6 presenting for spontaneous onset of labor. This H&P is being written after precipitous delivery in MAU. See delivery note for additional details. Patient reports contractions started around 8pm. When she arrived in MAU she reports stating she felt like the baby was about to come out. Had appointment in clinic today to have her membranes swept.  She received her prenatal care at [facility].  Support person in labor: Partner [employer] . 6w0: viability U/S . 12w0: appropriate interval growth . 19w0: normal anatomy U/S, cervix measuring 3.3, cerclage visualized, fundal placenta . 23w0: normal cervical length  Prenatal History/Complications: . History of incompetent cervix - cerclage placed this pregnancy . Rh negative - received Rhogam . Increased weight gain in pregnancy  Past Medical History: Past Medical History:  Diagnosis Date  . Poor dental hygiene    cracked/cavities  . Pregnant   . Preterm delivery   . Preterm labor   . SVD (spontaneous vaginal delivery) 08/2013   svd - fetal loss at 27 wks    Past Surgical History: Past Surgical History:  Procedure Laterality Date  . CERVICAL CERCLAGE N/A 02/15/2014   Procedure: CERCLAGE CERVICAL;  Surgeon: Catalina Antigua, MD;  Location: [facility] ORS;  Service: Gynecology;  Laterality: N/A;  . CERVICAL CERCLAGE N/A 04/25/2018   Procedure: CERCLAGE CERVICAL;  Surgeon: Tereso Newcomer, MD;  Location: [facility] BIRTHING SUITES;  Service: Gynecology;  Laterality: N/A;  . TOOTH EXTRACTION      Obstetrical History: OB History    Gravida  3   Para  2   Term  1   Preterm  1   AB  0   Living  1     SAB  0   TAB  0   Ectopic  0   Multiple  0   Live Births  1           Social History: Social History   Socioeconomic History  . Marital status: Single    Spouse name: Not on file  . Number of children: Not on file  .  Years of education: Not on file  . Highest education level: Not on file  Occupational History  . Not on file  Social Needs  . Financial resource strain: Not hard at all  . Food insecurity:    Worry: Never true    Inability: Never true  . Transportation needs:    Medical: No    Non-medical: No  Tobacco Use  . Smoking status: Never Smoker  . Smokeless tobacco: Never Used  Substance and Sexual Activity  . Alcohol use: No  . Drug use: No  . Sexual activity: Not Currently    Birth control/protection: None  Lifestyle  . Physical activity:    Days per week: Not on file    Minutes per session: Not on file  . Stress: Only a little  Relationships  . Social connections:    Talks on phone: Not on file    Gets together: Not on file    Attends religious service: Not on file    Active member of club or organization: Not on file    Attends meetings of clubs or organizations: Not on file    Relationship status: Not on file  Other Topics Concern  . Not on file  Social History Narrative  . Not on file    Family History: Family History  Problem Relation Age of Onset  . Hypertension Mother   . Hypertension Sister     Allergies: No Known Allergies  Medications Prior to Admission  Medication Sig Dispense Refill Last Dose  . Prenatal Vit-Fe Fumarate-FA (PRENATAL MULTIVITAMIN) TABS tablet Take 1 tablet by mouth daily at 12 noon.   10/20/2018 at Unknown time     Review of Systems  All systems reviewed and negative except as stated in HPI  Blood pressure 123/76, pulse 98, temperature 99.4 F (37.4 C), resp. rate 18, height 5\' 6"  (1.676 m), weight 91.4 kg, last menstrual period 01/17/2018, SpO2 100 %, unknown if currently breastfeeding. General appearance: screaming, uncomfortable appearing Lungs: no respiratory distress Pelvic: infant crowing  Extremities: no significant LE edema noted  Presentation: cephalic Fetal monitoring: positive heart tones by doppler on arrival to MAU    Prenatal labs: ABO, Rh: --/--/A NEG (10/07 1112) Antibody: Negative (01/15 0913) Rubella: 1.22 (09/04 1432) RPR: Non Reactive (01/15 0913)  HBsAg: Negative (09/04 1432)  HIV: Non Reactive (01/15 0913)  GBS:   negative  Glucola: normal 2-hr GTT Genetic screening:  Low risk NIPS  AFP negative screen  Prenatal Transfer Tool  Maternal Diabetes: No Genetic Screening: Normal Maternal Ultrasounds/Referrals: Normal Fetal Ultrasounds or other Referrals:  None Maternal Substance Abuse:  No Significant Maternal Medications: prenatal vitamins Significant Maternal Lab Results: None  No results found for this or any previous visit (from the past 24 hour(s)).  Patient Active Problem List   Diagnosis Date Noted  . History of cervical cerclage 10/21/2018  . Precipitous delivery 10/20/2018  . Excessive weight gain affecting pregnancy 08/03/2018  . Trichomoniasis 08/03/2018  . Chlamydia infection affecting pregnancy in second trimester 04/12/2018  . Supervision of high risk pregnancy, antepartum, first trimester 03/23/2018  . History of incompetent cervix, currently pregnant 03/23/2018  . Rh negative status during pregnancy 03/23/2018    Assessment/Plan:  Rhonda Gordon is a 28 y.o. G3P1101 at [redacted]w[redacted]d here for spontaneous onset of labor. Precipitous delivery in MAU. Please see delivery note for additional details.   Postpartum Planning -- bottle/undecided -- RI/[x] Tdap   Rylynn Schoneman S. Earlene Plater, DO OB/GYN Fellow

## 2018-10-21 NOTE — Discharge Summary (Signed)
Obstetrics Discharge Summary OB/GYN Faculty Practice   Patient Name: Rhonda Gordon DOB: 07/19/1991 MRN: [id number]  Date of admission: 10/20/2018 Delivering MD: Aviva Signs   Date of discharge: 10/22/2018  Admitting diagnosis: CTX Intrauterine pregnancy: [redacted]w[redacted]d     Secondary diagnosis:   Principal Problem:   Precipitous delivery Active Problems:   History of incompetent cervix, currently pregnant   Rh negative status during pregnancy   History of cervical cerclage   Discharge diagnosis: Term pregnancy, precipitous delivery                                            Postpartum procedures: Rhogam given  Complications: None  Outpatient Follow-Up: [ ]  continue to counsel on method of contraception  Hospital course: Rhonda Gordon is a 28 y.o. [redacted]w[redacted]d who was admitted for spontaneous onset of labor. Her pregnancy was complicated by above noted. Her labor course was notable for precipitous delivery in MAU. Delivery was complicated by periurethral laceration repaired. Please see delivery/op note for additional details. Her postpartum course was uncomplicated. She was breastfeeding without difficulty but planning to transition to mostly formula. By day of discharge, she was passing flatus, urinating, eating and drinking without difficulty. Her pain was well-controlled, and she was discharged home with ibuprofen. She will follow-up in clinic in 4-6 weeks.   Physical exam  Vitals:   10/21/18 0600 10/21/18 1000 10/21/18 1500 10/21/18 2239  BP: 117/63 102/70 117/70 127/67  Pulse: 77 82 77 79  Resp: 16 17 16 18   Temp: 99 F (37.2 C) 98.6 F (37 C) 98.4 F (36.9 C) 97.6 F (36.4 C)  TempSrc: Oral Oral Oral Oral  SpO2: 100% 100% 100% 100%  Weight:      Height:       General: well-appearing, NAD Lochia: appropriate Uterine Fundus: firm Incision: N/A DVT Evaluation: No significant calf/ankle edema.  Labs: Lab Results  Component Value Date   WBC 10.0 10/21/2018   HGB 10.5 (L)  10/21/2018   HCT 31.1 (L) 10/21/2018   MCV 88.4 10/21/2018   PLT 212 10/21/2018   CMP Latest Ref Rng & Units 02/25/2018  Glucose 70 - 99 mg/dL 85  BUN 6 - 20 mg/dL 7  Creatinine 1.06 - 2.69 mg/dL 4.85  Sodium 462 - 703 mmol/L 137  Potassium 3.5 - 5.1 mmol/L 3.3(L)  Chloride 98 - 111 mmol/L 102  CO2 22 - 32 mmol/L 22  Calcium 8.9 - 10.3 mg/dL 9.1  Total Protein 6.5 - 8.1 g/dL 7.4  Total Bilirubin 0.3 - 1.2 mg/dL 5.0(K)  Alkaline Phos 38 - 126 U/L 66  AST 15 - 41 U/L 17  ALT 0 - 44 U/L 15    Discharge instructions: Per After Visit Summary and "Baby and Me Booklet"  After visit meds:  Allergies as of 10/22/2018   No Known Allergies     Medication List    TAKE these medications   ibuprofen 800 MG tablet Commonly known as:  ADVIL,MOTRIN Take 1 tablet (800 mg total) by mouth 3 (three) times daily.   prenatal multivitamin Tabs tablet Take 1 tablet by mouth daily at 12 noon.   senna-docusate 8.6-50 MG tablet Commonly known as:  Senokot-S Take 2 tablets by mouth at bedtime as needed for mild constipation.      Postpartum contraception: Undecided Diet: Routine Diet Activity: Advance as tolerated. Pelvic rest for 6 weeks.  Follow-up Appt:No future appointments. Follow-up Visit:No follow-ups on file.  Please schedule this patient for Postpartum visit in: 4 weeks with the following provider: Any provider High risk pregnancy complicated by: hx cervical incompetence with cerclage placement Delivery mode:  SVD Anticipated Birth Control:  undecided PP Procedures needed: none  Schedule Integrated BH visit: no  Newborn Data: Live born female  Birth Weight:   APGAR: 9, 9  Newborn Delivery   Birth date/time:  10/20/2018 23:08:00 Delivery type:  Vaginal, Spontaneous    Baby Feeding: Bottle and Breast Disposition:home with mother  Cristal Deer. Earlene Plater, DO OB/GYN Fellow, Faculty Practice

## 2018-10-21 NOTE — Progress Notes (Signed)
Post Partum Day 1 Subjective: No complaints. Precipitous delivery in MAU overnight. Able to get some sleep, no trouble using bathroom.   Objective: Blood pressure 117/63, pulse 77, temperature 99 F (37.2 C), temperature source Oral, resp. rate 16, height 5\' 6"  (1.676 m), weight 91.4 kg, last menstrual period 01/17/2018, SpO2 100 %, unknown if currently breastfeeding.  Physical Exam:  General: alert, well-appearing, NAD Lochia: appropriate Uterine Fundus: firm Incision: n/a DVT Evaluation: No significant calf/ankle edema.  No results for input(s): HGB, HCT in the last 72 hours.  Assessment/Plan: Plan for discharge tomorrow  Routine postpartum care   LOS: 1 day   Antwione Picotte S Basya Casavant, DO 10/21/2018, 9:22 AM

## 2018-10-21 NOTE — MAU Note (Signed)
Pt came in complete w/bulging bag. FHT confirmed via doppler - 145. CNM at bedside at 2304, pt delivered at 2308.

## 2018-10-22 ENCOUNTER — Encounter (HOSPITAL_COMMUNITY): Payer: Self-pay | Admitting: *Deleted

## 2018-10-22 LAB — RH IG WORKUP (INCLUDES ABO/RH)
ABO/RH(D): A NEG
Antibody Screen: NEGATIVE
Fetal Screen: NEGATIVE
Gestational Age(Wks): 39
Unit division: 0

## 2018-10-22 LAB — RPR: RPR Ser Ql: NONREACTIVE

## 2018-10-22 MED ORDER — SENNOSIDES-DOCUSATE SODIUM 8.6-50 MG PO TABS: 2.0000 | ORAL_TABLET | Freq: Every evening | ORAL | Status: AC | PRN

## 2018-10-22 MED ORDER — IBUPROFEN 800 MG PO TABS: 800.0000 mg | ORAL_TABLET | Freq: Three times a day (TID) | ORAL | 0 refills | Status: AC

## 2018-10-22 MED ORDER — IBUPROFEN 800 MG PO TABS: 800.0000 mg | ORAL_TABLET | Freq: Three times a day (TID) | ORAL | 0 refills | Status: DC

## 2018-10-22 NOTE — Lactation Note (Signed)
This note was copied from a baby's chart. Lactation Consultation Note  Patient Name: Rhonda Gordon [id number] Date: 10/22/2018 Reason for consult: Follow-up assessment;Term;1st time breastfeeding  P1 mother whose infant is now 42 hours old.  This is mother's first time breast feeding.  Her feeding choice on admission is breast/bottle.  Mother has not breast fed since 0447 on 10/21/18.  She has been giving formula.  Mother does not wish assistance with latching at this time.  She stated that her breasts feel heavier today.  Engorgement prevention/treatment discussed.  Encouraged her to put baby to breast with feeding cues prior to supplementation.    Engorgement prevention/treatment discussed.  Mother is not a [facility] participant.  She does not have a DEBP for home use and the hospital is backordered on manual pumps.  Educated mother on the importance of hand expression to help relieve discomfort if she becomes engorged.  Mother verbalized understanding.  She has our OP phone number for questions after discharge.  Father present.   Maternal Data Formula Feeding for Exclusion: Yes Reason for exclusion: Mother's choice to formula and breast feed on admission Has patient been taught Hand Expression?: Yes Does the patient have breastfeeding experience prior to this delivery?: No  Feeding Feeding Type: Bottle Fed - Formula Nipple Type: Slow - flow  LATCH Score                   Interventions    Lactation Tools Discussed/Used WIC Program: No   Consult Status Consult Status: Complete Date: 10/22/18 Follow-up type: Call as needed    Rhonda Gordon R [name] 10/22/2018, 10:06 AM

## 2018-10-28 ENCOUNTER — Inpatient Hospital Stay (HOSPITAL_COMMUNITY): Payer: Medicaid Other

## 2018-10-31 ENCOUNTER — Telehealth: Payer: Self-pay | Admitting: Family Medicine

## 2018-10-31 NOTE — Telephone Encounter (Signed)
Called the patient to inform of upcoming appointment however the contact number is non-working. Sending a message to the patient via mychart.

## 2018-11-21 ENCOUNTER — Encounter: Payer: Self-pay | Admitting: Family Medicine

## 2018-11-21 ENCOUNTER — Ambulatory Visit: Payer: Medicaid Other

## 2018-11-21 DIAGNOSIS — Z91199 Patient's noncompliance with other medical treatment and regimen due to unspecified reason: Secondary | ICD-10-CM

## 2018-11-21 DIAGNOSIS — Z5329 Procedure and treatment not carried out because of patient's decision for other reasons: Secondary | ICD-10-CM

## 2018-11-21 NOTE — Progress Notes (Signed)
Attempted to call patient for virtual PP appointment. No answer. Will have patient reschedule.  Rolm Bookbinder, CNM 11/21/18 8:59 AM

## 2020-10-08 ENCOUNTER — Other Ambulatory Visit: Payer: Self-pay

## 2020-10-08 ENCOUNTER — Emergency Department (HOSPITAL_COMMUNITY)
Admission: EM | Admit: 2020-10-08 | Discharge: 2020-10-08 | Disposition: A | Discharge status: Home / Self Care | Payer: Medicaid Other | Attending: Emergency Medicine | Admitting: Emergency Medicine

## 2020-10-08 DIAGNOSIS — K047 Periapical abscess without sinus: Secondary | ICD-10-CM | POA: Diagnosis not present

## 2020-10-08 DIAGNOSIS — K0889 Other specified disorders of teeth and supporting structures: Secondary | ICD-10-CM | POA: Diagnosis present

## 2020-10-08 MED ORDER — CHLORHEXIDINE GLUCONATE 0.12 % MT SOLN: 15.0000 mL | Freq: Two times a day (BID) | OROMUCOSAL | 0 refills | Status: AC

## 2020-10-08 MED ORDER — OXYCODONE-ACETAMINOPHEN 5-325 MG PO TABS
1.0000 | ORAL_TABLET | ORAL | Status: DC | PRN
  Administered 2020-10-08: 1 via ORAL
  Filled 2020-10-08: qty 1

## 2020-10-08 MED ORDER — PENICILLIN V POTASSIUM 500 MG PO TABS: 500.0000 mg | ORAL_TABLET | Freq: Four times a day (QID) | ORAL | 0 refills | Status: AC

## 2020-10-08 MED ORDER — HYDROCODONE-ACETAMINOPHEN 5-325 MG PO TABS: 1.0000 | ORAL_TABLET | ORAL | 0 refills | Status: AC | PRN

## 2020-10-08 NOTE — Discharge Instructions (Addendum)
Please take the antibiotics as directed until finished.  Please take the pain medicine as needed for pain, you may supplement with ibuprofen.  Do not take additional Tylenol.  You need to make sure you follow-up with a dentist, I provided a resource guide for this.  [facility] is also an option.  Return to the ER for any new or worsening symptoms.

## 2020-10-08 NOTE — ED Triage Notes (Signed)
C/o toothache for days, and now has swelling

## 2020-10-08 NOTE — ED Provider Notes (Signed)
[facility] EMERGENCY DEPARTMENT Provider Note   CSN: [id number] Arrival date & time: 10/08/20  1110     History Chief Complaint  Patient presents with   Oral Swelling    Delayni Fels is a 30 y.o. female.  HPI 30 year old female with a history of poor dental hygiene [facility] to the ER with right tooth pain and swelling which began this morning.  Patient states that she has been having pain in her right lower molars which has been intermittent for about a week.  This morning she woke up with some lower jaw swelling.  Denies any difficulty swallowing or drooling.  No muffled voice.  No tongue swelling.  Has not taken anything for pain.  Does not have a dentist and is not insured.    Past Medical History:  Diagnosis Date   Poor dental hygiene    cracked/cavities   Pregnant    Preterm delivery    Preterm labor    SVD (spontaneous vaginal delivery) 08/2013   svd - fetal loss at 27 wks    Patient Active Problem List   Diagnosis Date Noted   History of cervical cerclage 10/21/2018   Precipitous delivery 10/20/2018   Excessive weight gain affecting pregnancy 08/03/2018   Trichomoniasis 08/03/2018   Chlamydia infection affecting pregnancy in second trimester 04/12/2018   Supervision of high risk pregnancy, antepartum, first trimester 03/23/2018   History of incompetent cervix, currently pregnant 03/23/2018   Rh negative status during pregnancy 03/23/2018    Past Surgical History:  Procedure Laterality Date   CERVICAL CERCLAGE N/A 02/15/2014   Procedure: CERCLAGE CERVICAL;  Surgeon: Catalina Antigua, MD;  Location: [facility];  Service: Gynecology;  Laterality: N/A;   CERVICAL CERCLAGE N/A 04/25/2018   Procedure: CERCLAGE CERVICAL;  Surgeon: Tereso Newcomer, MD;  Location: [facility];  Service: Gynecology;  Laterality: N/A;   TOOTH EXTRACTION       OB History    Gravida  3   Para  2   Term  1   Preterm  1   AB  0   Living  1      SAB  0   IAB  0   Ectopic  0   Multiple  0   Live Births  1           Family History  Problem Relation Age of Onset   Hypertension Mother    Hypertension Sister     Social History   Tobacco Use   Smoking status: Never Smoker   Smokeless tobacco: Never Used  Vaping Use   Vaping Use: Never used  Substance Use Topics   Alcohol use: No   Drug use: No    Home Medications Prior to Admission medications   Medication Sig Start Date End Date Taking? Authorizing Provider  chlorhexidine (PERIDEX) 0.12 % solution Use as directed 15 mLs in the mouth or throat 2 (two) times daily. 10/08/20  Yes Mare Ferrari, PA-C  HYDROcodone-acetaminophen (NORCO/VICODIN) 5-325 MG tablet Take 1 tablet by mouth every 4 (four) hours as needed for up to 5 days. 10/08/20 10/13/20 Yes Mare Ferrari, PA-C  penicillin v potassium (VEETID) 500 MG tablet Take 1 tablet (500 mg total) by mouth 4 (four) times daily for 7 days. 10/08/20 10/15/20 Yes Mare Ferrari, PA-C  ibuprofen (ADVIL,MOTRIN) 800 MG tablet Take 1 tablet (800 mg total) by mouth 3 (three) times daily. 10/22/18   Tamera Stands, DO  Prenatal Vit-Fe Fumarate-FA (PRENATAL  MULTIVITAMIN) TABS tablet Take 1 tablet by mouth daily at 12 noon.    [provider]  senna-docusate (SENOKOT-S) 8.6-50 MG tablet Take 2 tablets by mouth at bedtime as needed for mild constipation. 10/22/18   Tamera Stands, DO    Allergies    Patient has no known allergies.  Review of Systems   Review of Systems  Constitutional: Negative for fever.  HENT: Positive for dental problem. Negative for drooling, sore throat, trouble swallowing and voice change.     Physical Exam Updated Vital Signs BP 109/69 (BP Location: Left Arm)    Pulse 69    Temp 98.4 F (36.9 C) (Oral)    Resp 20    SpO2 95%   Physical Exam Vitals reviewed.  Constitutional:      Appearance: Normal appearance.  HENT:     Head: Normocephalic and atraumatic.      Mouth/Throat:     Mouth: Mucous membranes are moist. Mucous membranes are pale. No injury, lacerations, oral lesions or angioedema.     Dentition: Dental tenderness, dental caries and dental abscesses present. No gum lesions.     Tongue: No lesions. Tongue does not deviate from midline.     Palate: No mass and lesions.     Pharynx: Oropharynx is clear. Uvula midline.     Tonsils: No tonsillar exudate or tonsillar abscesses.      Comments: Poor dentition throughout, with visible multiple dental caries and cracked/missing teeth.  Visible swelling to the right lower jaw without evidence of focal abscess.  No tripoding   Oropharynx non erythematous without exudates, uvula midline, no unilateral tonsillar swelling, tongue normal size and midline, no sublingual/submandibular swelling, tolerating secretions well    Eyes:     General:        Right eye: No discharge.        Left eye: No discharge.     Extraocular Movements: Extraocular movements intact.     Conjunctiva/sclera: Conjunctivae normal.  Pulmonary:     Effort: Pulmonary effort is normal.  Musculoskeletal:        General: No swelling. Normal range of motion.     Cervical back: Normal range of motion.  Neurological:     General: No focal deficit present.     Mental Status: She is alert and oriented to person, place, and time.  Psychiatric:        Mood and Affect: Mood normal.        Behavior: Behavior normal.     ED Results / Procedures / Treatments   Labs (all labs ordered are listed, but only abnormal results are displayed) Labs Reviewed - No data to display  EKG None  Radiology No results found.  Procedures Procedures   Medications Ordered in ED Medications  oxyCODONE-acetaminophen (PERCOCET/ROXICET) 5-325 MG per tablet 1 tablet (1 tablet Oral Given 10/08/20 1221)    ED Course  I have reviewed the triage vital signs and the nursing notes.  Pertinent labs & imaging results that were available during my care of  the patient were reviewed by me and considered in my medical decision making (see chart for details).    MDM Rules/Calculators/A&P                          Patient with toothache and swelling, consistent with gross abscess.  Exam unconcerning for Ludwig's angina or spread of infection.  Will treat with penicillin, will provide short course of Norco, PDMP  reviewed, appropriate. Will also give peridex mouthwash.  Urged patient to follow-up with dentist.  Resources provided. Return precautions discussed. Stable for discharge   Final Clinical Impression(s) / ED Diagnoses Final diagnoses:  Dental abscess    Rx / DC Orders ED Discharge Orders         Ordered    penicillin v potassium (VEETID) 500 MG tablet  4 times daily        10/08/20 1503    chlorhexidine (PERIDEX) 0.12 % solution  2 times daily        10/08/20 1503    HYDROcodone-acetaminophen (NORCO/VICODIN) 5-325 MG tablet  Every 4 hours PRN        10/08/20 1503           Leone Brand 10/08/20 1509    Vanetta Mulders, MD 10/12/20 [phone]

## 2020-10-08 NOTE — ED Notes (Signed)
Discharge paperwork given to pt along with prescriptions. Pt agreeable to discharge and understands instructions. Esignature pad not working. 

## 2020-10-09 ENCOUNTER — Telehealth: Payer: Self-pay | Admitting: *Deleted

## 2020-10-09 NOTE — Telephone Encounter (Signed)
Transition Care Management Follow-up Telephone Call  Date of discharge and from where: 3-22 [facility]  How have you been since you were released from the hospital? Feeling better  Any questions or concerns? No  Items Reviewed:  Did the pt receive and understand the discharge instructions provided? Yes   Medications obtained and verified? Yes   Other? No   Any new allergies since your discharge? No   Dietary orders reviewed? na  Do you have support at home? Yes    Functional Questionnaire: (I = Independent and D = Dependent) ADLs: I  Bathing/Dressing- I  Meal Prep- I  Eating- I  Maintaining continence- I  Transferring/Ambulation- I  Managing Meds- I  Follow up appointments reviewed:   PCP Hospital f/u appt confirmed? No    Specialist Hospital f/u appt confirmed? No  Patient will be calling today to make an appoitment  Are transportation arrangements needed? No   If their condition worsens, is the pt aware to call PCP or go to the Emergency Dept.? yes  Was the patient provided with contact information for the PCP's office or ED? yes  Was to pt encouraged to call back with questions or concerns? yes

## 2022-01-16 ENCOUNTER — Other Ambulatory Visit: Payer: Self-pay

## 2022-01-16 ENCOUNTER — Encounter (HOSPITAL_COMMUNITY): Payer: Self-pay | Admitting: *Deleted

## 2022-01-16 ENCOUNTER — Ambulatory Visit (HOSPITAL_COMMUNITY)
Admission: EM | Admit: 2022-01-16 | Discharge: 2022-01-16 | Disposition: A | Discharge status: Home / Self Care | Payer: Medicaid Other | Attending: Student | Admitting: Student

## 2022-01-16 DIAGNOSIS — Z3202 Encounter for pregnancy test, result negative: Secondary | ICD-10-CM

## 2022-01-16 DIAGNOSIS — N760 Acute vaginitis: Secondary | ICD-10-CM | POA: Diagnosis not present

## 2022-01-16 LAB — POCT URINALYSIS DIPSTICK, ED / UC
Bilirubin Urine: NEGATIVE
Glucose, UA: NEGATIVE mg/dL
Hgb urine dipstick: NEGATIVE
Leukocytes,Ua: NEGATIVE
Nitrite: NEGATIVE
Protein, ur: NEGATIVE mg/dL
Specific Gravity, Urine: 1.025 (ref 1.005–1.030)
Urobilinogen, UA: 0.2 mg/dL (ref 0.0–1.0)
pH: 6 (ref 5.0–8.0)

## 2022-01-16 LAB — POC URINE PREG, ED: Preg Test, Ur: NEGATIVE

## 2022-01-16 MED ORDER — DOXYCYCLINE HYCLATE 100 MG PO CAPS: 100.0000 mg | ORAL_CAPSULE | Freq: Two times a day (BID) | ORAL | 0 refills | Status: AC

## 2022-01-16 NOTE — ED Provider Notes (Signed)
[facility]    CSN: [id number] Arrival date & time: 01/16/22  1212      History   Chief Complaint No chief complaint on file.   HPI Rhonda Gordon is a 31 y.o. female presenting with multiple concerns: breast pain intermittently x months; groin abscesses that have waxed and waned for 3 months; vaginal discharge x1 month. History PID and chlamydia, states she is sexually active with the same female partner and is not sure if he has been treated. States the groin abscesses drain and improve but then return. Has never been diagnosed with hidradenitis. Similar lesions in the R axilla. States the L breast intermittently hurts but never any appreciable lumps or nipple discharge, she is not breastfeeding. States some vaginal discharge, but no odor or lower abd pain. Denies flank pain, dysuria, hematuria, frequency, urgency. This is her first visit for these symptoms.   HPI  Past Medical History:  Diagnosis Date   Poor dental hygiene    cracked/cavities   Pregnant    Preterm delivery    Preterm labor    SVD (spontaneous vaginal delivery) 08/2013   svd - fetal loss at 27 wks    Patient Active Problem List   Diagnosis Date Noted   History of cervical cerclage 10/21/2018   Precipitous delivery 10/20/2018   Excessive weight gain affecting pregnancy 08/03/2018   Trichomoniasis 08/03/2018   Chlamydia infection affecting pregnancy in second trimester 04/12/2018   Supervision of high risk pregnancy, antepartum, first trimester 03/23/2018   History of incompetent cervix, currently pregnant 03/23/2018   Rh negative status during pregnancy 03/23/2018    Past Surgical History:  Procedure Laterality Date   CERVICAL CERCLAGE N/A 02/15/2014   Procedure: CERCLAGE CERVICAL;  Surgeon: Catalina Antigua, MD;  Location: [facility];  Service: Gynecology;  Laterality: N/A;   CERVICAL CERCLAGE N/A 04/25/2018   Procedure: CERCLAGE CERVICAL;  Surgeon: Tereso Newcomer, MD;  Location: [facility];  Service: Gynecology;  Laterality: N/A;   TOOTH EXTRACTION      OB History     Gravida  3   Para  2   Term  1   Preterm  1   AB  0   Living  1      SAB  0   IAB  0   Ectopic  0   Multiple  0   Live Births  1            Home Medications    Prior to Admission medications   Medication Sig Start Date End Date Taking? Authorizing Provider  doxycycline (VIBRAMYCIN) 100 MG capsule Take 1 capsule (100 mg total) by mouth 2 (two) times daily for 7 days. 01/16/22 01/23/22 Yes Rhys Martini, PA-C  chlorhexidine (PERIDEX) 0.12 % solution Use as directed 15 mLs in the mouth or throat 2 (two) times daily. 10/08/20   Mare Ferrari, PA-C  ibuprofen (ADVIL,MOTRIN) 800 MG tablet Take 1 tablet (800 mg total) by mouth 3 (three) times daily. 10/22/18   Tamera Stands, DO  Prenatal Vit-Fe Fumarate-FA (PRENATAL MULTIVITAMIN) TABS tablet Take 1 tablet by mouth daily at 12 noon.    [provider]  senna-docusate (SENOKOT-S) 8.6-50 MG tablet Take 2 tablets by mouth at bedtime as needed for mild constipation. 10/22/18   Tamera Stands, DO    Family History Family History  Problem Relation Age of Onset   Hypertension Mother    Hypertension Sister     Social History Social  History   Tobacco Use   Smoking status: Never   Smokeless tobacco: Never  Vaping Use   Vaping Use: Never used  Substance Use Topics   Alcohol use: No   Drug use: No     Allergies   Patient has no known allergies.   Review of Systems Review of Systems  Constitutional:  Negative for chills and fever.  HENT:  Negative for sore throat.   Eyes:  Negative for pain and redness.  Respiratory:  Negative for shortness of breath.   Cardiovascular:  Negative for chest pain.  Gastrointestinal:  Negative for abdominal pain, diarrhea, nausea and vomiting.  Genitourinary:  Positive for vaginal discharge. Negative for decreased urine volume, difficulty urinating, dysuria, flank pain, frequency,  genital sores, hematuria, menstrual problem, pelvic pain and urgency.  Musculoskeletal:  Negative for back pain.  Skin:  Negative for rash.  All other systems reviewed and are negative.    Physical Exam Triage Vital Signs ED Triage Vitals  Enc Vitals Group     BP 01/16/22 1307 128/69     Pulse Rate 01/16/22 1307 76     Resp 01/16/22 1307 18     Temp 01/16/22 1307 98.4 F (36.9 C)     Temp src --      SpO2 01/16/22 1307 98 %     Weight --      Height --      Head Circumference --      Peak Flow --      Pain Score 01/16/22 1308 8     Pain Loc --      Pain Edu? --      Excl. in GC? --    No data found.  Updated Vital Signs BP 128/69   Pulse 76   Temp 98.4 F (36.9 C)   Resp 18   LMP 11/21/2021   SpO2 98%   Breastfeeding No   Visual Acuity Right Eye Distance:   Left Eye Distance:   Bilateral Distance:    Right Eye Near:   Left Eye Near:    Bilateral Near:     Physical Exam Vitals reviewed.  Constitutional:      General: She is not in acute distress.    Appearance: Normal appearance. She is not ill-appearing.  HENT:     Head: Normocephalic and atraumatic.     Mouth/Throat:     Mouth: Mucous membranes are moist.     Comments: Moist mucous membranes Eyes:     Extraocular Movements: Extraocular movements intact.     Pupils: Pupils are equal, round, and reactive to light.  Cardiovascular:     Rate and Rhythm: Normal rate and regular rhythm.     Heart sounds: Normal heart sounds.  Pulmonary:     Effort: Pulmonary effort is normal.     Breath sounds: Normal breath sounds. No wheezing, rhonchi or rales.  Chest:     Comments: Chaperone: Torrie, CMA  L breast - no reproducible tenderness or abscess or mass. No nipple discharge. No skin changes Abdominal:     General: Bowel sounds are normal. There is no distension.     Palpations: Abdomen is soft. There is no mass.     Tenderness: There is no abdominal tenderness. There is no right CVA tenderness, left CVA  tenderness, guarding or rebound.  Genitourinary:    Comments: Chaperone: Torrie, CMA.  Thick white vaginal discharge visible external vagina. Speculum exam not performed. No genital lesion. There are several small abscesses  on the buttocks/ groin that are in various stages of healing. No currently fluctuance or drainage.  Skin:    General: Skin is warm.     Capillary Refill: Capillary refill takes less than 2 seconds.     Comments: Good skin turgor R axilla - no appreciable abscess   Neurological:     General: No focal deficit present.     Mental Status: She is alert and oriented to person, place, and time.  Psychiatric:        Mood and Affect: Mood normal.        Behavior: Behavior normal.      UC Treatments / Results  Labs (all labs ordered are listed, but only abnormal results are displayed) Labs Reviewed  POCT URINALYSIS DIPSTICK, ED / UC - Abnormal; Notable for the following components:      Result Value   Ketones, ur TRACE (*)    All other components within normal limits  POC URINE PREG, ED  CERVICOVAGINAL ANCILLARY ONLY    EKG   Radiology No results found.  Procedures Procedures (including critical care time)  Medications Ordered in UC Medications - No data to display  Initial Impression / Assessment and Plan / UC Course  I have reviewed the triage vital signs and the nursing notes.  Pertinent labs & imaging results that were available during my care of the patient were reviewed by me and considered in my medical decision making (see chart for details).     This patient is a very pleasant 31 y.o. year old female presenting with vaginitis; groin abscesses; L breast pain. Afebrile, nontachycardic, no reproducible abd pain or CVAT.  U-preg negative. UA wnl.   There are several small immature abscesses in the groin. Will cover for both chlamydia and skin abscess with doxycycline. I collected cervicovaginal swab for G/C, trich, yeast, BV testing. Declines HIV,  RPR. Safe sex precautions.   I did not appreciate a breast mass or cyst on exam. Given waxing and waning breast pain for months will order breast US. She is in agreement   ED return precautions discussed. Patient verbalizes understanding and agreement.    Final Clinical Impressions(s) / UC Diagnoses   Final diagnoses:  Vaginitis and vulvovaginitis  Negative pregnancy test     Discharge Instructions      -Doxycycline twice daily for 7 days.  Make sure to wear sunscreen while spending time outside while on this medication as it can increase your chance of sunburn. You can take this medication with food if you have a sensitive stomach. This is the treatment for chlamydia and for skin infections.  -We have sent testing for sexually transmitted infections. We will notify you of any positive results once they are received. If required, we will prescribe any medications you might need. Please refrain from all sexual activity until treatment is complete.  -Seek additional medical attention if you develop fevers/chills, new/worsening abdominal pain, new/worsening vaginal discomfort/discharge, etc.  -I've ordered a breast ultrasound. Someone should call you to set this up.   ED Prescriptions     Medication Sig Dispense Auth. Provider   doxycycline (VIBRAMYCIN) 100 MG capsule Take 1 capsule (100 mg total) by mouth 2 (two) times daily for 7 days. 14 capsule Hazel Sams, PA-C      PDMP not reviewed this encounter.   Hazel Sams, PA-C 01/16/22 1354

## 2022-01-16 NOTE — Discharge Instructions (Addendum)
-  Doxycycline twice daily for 7 days.  Make sure to wear sunscreen while spending time outside while on this medication as it can increase your chance of sunburn. You can take this medication with food if you have a sensitive stomach. This is the treatment for chlamydia and for skin infections.  -We have sent testing for sexually transmitted infections. We will notify you of any positive results once they are received. If required, we will prescribe any medications you might need. Please refrain from all sexual activity until treatment is complete.  -Seek additional medical attention if you develop fevers/chills, new/worsening abdominal pain, new/worsening vaginal discomfort/discharge, etc.  -I've ordered a breast ultrasound. Someone should call you to set this up.

## 2022-01-16 NOTE — ED Triage Notes (Signed)
Pt reports vag pain for 3 months with bumps in the inner thigh area and buttocks. Pt also has pain located RT/lt axilla

## 2022-01-19 LAB — CERVICOVAGINAL ANCILLARY ONLY
Bacterial Vaginitis (gardnerella): NEGATIVE
Candida Glabrata: NEGATIVE
Candida Vaginitis: NEGATIVE
Chlamydia: NEGATIVE
Comment: NEGATIVE
Comment: NEGATIVE
Comment: NEGATIVE
Comment: NEGATIVE
Comment: NEGATIVE
Comment: NORMAL
Neisseria Gonorrhea: NEGATIVE
Trichomonas: NEGATIVE

## 2022-10-06 ENCOUNTER — Emergency Department (HOSPITAL_COMMUNITY)
Admission: EM | Admit: 2022-10-06 | Discharge: 2022-10-06 | Disposition: A | Discharge status: Home / Self Care | Payer: Medicaid Other | Attending: Student | Admitting: Student

## 2022-10-06 ENCOUNTER — Other Ambulatory Visit: Payer: Self-pay

## 2022-10-06 DIAGNOSIS — N3000 Acute cystitis without hematuria: Secondary | ICD-10-CM | POA: Insufficient documentation

## 2022-10-06 DIAGNOSIS — R300 Dysuria: Secondary | ICD-10-CM | POA: Diagnosis present

## 2022-10-06 DIAGNOSIS — N760 Acute vaginitis: Secondary | ICD-10-CM | POA: Insufficient documentation

## 2022-10-06 DIAGNOSIS — B9689 Other specified bacterial agents as the cause of diseases classified elsewhere: Secondary | ICD-10-CM

## 2022-10-06 LAB — CBC WITH DIFFERENTIAL/PLATELET
Abs Immature Granulocytes: 0.01 10*3/uL (ref 0.00–0.07)
Basophils Absolute: 0 10*3/uL (ref 0.0–0.1)
Basophils Relative: 0 %
Eosinophils Absolute: 0.1 10*3/uL (ref 0.0–0.5)
Eosinophils Relative: 2 %
HCT: 36.5 % (ref 36.0–46.0)
Hemoglobin: 12.7 g/dL (ref 12.0–15.0)
Immature Granulocytes: 0 %
Lymphocytes Relative: 45 %
Lymphs Abs: 2.8 10*3/uL (ref 0.7–4.0)
MCH: 31.9 pg (ref 26.0–34.0)
MCHC: 34.8 g/dL (ref 30.0–36.0)
MCV: 91.7 fL (ref 80.0–100.0)
Monocytes Absolute: 0.4 10*3/uL (ref 0.1–1.0)
Monocytes Relative: 7 %
Neutro Abs: 2.9 10*3/uL (ref 1.7–7.7)
Neutrophils Relative %: 46 %
Platelets: 279 10*3/uL (ref 150–400)
RBC: 3.98 MIL/uL (ref 3.87–5.11)
RDW: 12.3 % (ref 11.5–15.5)
WBC: 6.2 10*3/uL (ref 4.0–10.5)
nRBC: 0 % (ref 0.0–0.2)

## 2022-10-06 LAB — HIV ANTIBODY (ROUTINE TESTING W REFLEX): HIV Screen 4th Generation wRfx: NONREACTIVE

## 2022-10-06 LAB — BASIC METABOLIC PANEL
Anion gap: 8 (ref 5–15)
BUN: 11 mg/dL (ref 6–20)
CO2: 23 mmol/L (ref 22–32)
Calcium: 8.9 mg/dL (ref 8.9–10.3)
Chloride: 104 mmol/L (ref 98–111)
Creatinine, Ser: 0.72 mg/dL (ref 0.44–1.00)
GFR, Estimated: >60 mL/min (ref >60)
Glucose, Bld: 86 mg/dL (ref 70–99)
Potassium: 3.6 mmol/L (ref 3.5–5.1)
Sodium: 135 mmol/L (ref 135–145)

## 2022-10-06 LAB — URINALYSIS, ROUTINE W REFLEX MICROSCOPIC
Bilirubin Urine: NEGATIVE
Glucose, UA: NEGATIVE mg/dL
Hgb urine dipstick: NEGATIVE
Ketones, ur: NEGATIVE mg/dL
Nitrite: POSITIVE — AB
Protein, ur: 30 mg/dL — AB
Specific Gravity, Urine: 1.019 (ref 1.005–1.030)
WBC, UA: >50 WBC/hpf (ref 0–5)
pH: 5 (ref 5.0–8.0)

## 2022-10-06 LAB — WET PREP, GENITAL
Sperm: NONE SEEN
Trich, Wet Prep: NONE SEEN
WBC, Wet Prep HPF POC: <10 (ref <10)
Yeast Wet Prep HPF POC: NONE SEEN

## 2022-10-06 LAB — PREGNANCY, URINE: Preg Test, Ur: NEGATIVE

## 2022-10-06 MED ORDER — CEPHALEXIN 500 MG PO CAPS: 500.0000 mg | ORAL_CAPSULE | Freq: Four times a day (QID) | ORAL | 0 refills | Status: AC

## 2022-10-06 MED ORDER — METRONIDAZOLE 500 MG PO TABS: 500.0000 mg | ORAL_TABLET | Freq: Two times a day (BID) | ORAL | 0 refills | Status: DC

## 2022-10-06 MED ORDER — METRONIDAZOLE 500 MG PO TABS: 500.0000 mg | ORAL_TABLET | Freq: Two times a day (BID) | ORAL | 0 refills | Status: AC

## 2022-10-06 MED ORDER — CEPHALEXIN 500 MG PO CAPS: 500.0000 mg | ORAL_CAPSULE | Freq: Four times a day (QID) | ORAL | 0 refills | Status: DC

## 2022-10-06 NOTE — ED Triage Notes (Addendum)
Patient reports pelvic pain /suprapubic pain with dysuria x 1 week , no fever or chills , denies hematuria or emesis .

## 2022-10-06 NOTE — ED Provider Notes (Signed)
[place] Provider Note   CSN: [id number] Arrival date & time: 10/06/22  2003     History  Chief Complaint  Patient presents with   Pelvic pain / Dysuria    Rhonda Gordon is a 32 y.o. female suprapubic abdominal pain for 1 week.  Patient states she recently had new sex partner and the day after started having dysuria and had water and cranberry juice as this usually helps her UTI-like symptoms however she is still experiencing symptoms.  LMP 09/27/2022   Denies chest pain, shortness of breath, nausea/vomiting, fever/chills, flank pain, headaches, vision changes, vaginal lesions or discharge  Home Medications Prior to Admission medications   Medication Sig Start Date End Date Taking? Authorizing Provider  cephALEXin (KEFLEX) 500 MG capsule Take 1 capsule (500 mg total) by mouth 4 (four) times daily. 10/06/22  Yes Penney Domanski, Florene Route, PA-C  metroNIDAZOLE (FLAGYL) 500 MG tablet Take 1 tablet (500 mg total) by mouth 2 (two) times daily. 10/06/22  Yes Dishawn Bhargava, Florene Route, PA-C  chlorhexidine (PERIDEX) 0.12 % solution Use as directed 15 mLs in the mouth or throat 2 (two) times daily. 10/08/20   Garald Balding, PA-C  ibuprofen (ADVIL,MOTRIN) 800 MG tablet Take 1 tablet (800 mg total) by mouth 3 (three) times daily. 10/22/18   Glenice Bow, DO  Prenatal Vit-Fe Fumarate-FA (PRENATAL MULTIVITAMIN) TABS tablet Take 1 tablet by mouth daily at 12 noon.    [provider]  senna-docusate (SENOKOT-S) 8.6-50 MG tablet Take 2 tablets by mouth at bedtime as needed for mild constipation. 10/22/18   Glenice Bow, DO      Allergies    Patient has no known allergies.    Review of Systems   Review of Systems See HPI Physical Exam Updated Vital Signs BP (!) 132/94 (BP Location: Right Arm)   Pulse 91   Temp 99 F (37.2 C)   Resp 19   LMP 09/27/2022   SpO2 100%  Physical Exam Vitals reviewed. Exam conducted with a chaperone present.   Constitutional:      General: She is not in acute distress. HENT:     Head: Normocephalic and atraumatic.  Eyes:     Conjunctiva/sclera: Conjunctivae normal.  Cardiovascular:     Rate and Rhythm: Regular rhythm. Tachycardia present.     Pulses: Normal pulses.     Heart sounds: Normal heart sounds.     Comments: 2+ bilateral radial/dorsalis pedis pulses with regular rate Pulmonary:     Effort: Pulmonary effort is normal. No respiratory distress.     Breath sounds: Normal breath sounds.  Abdominal:     Palpations: Abdomen is soft.     Tenderness: There is abdominal tenderness (Suprapubic tenderness). There is no right CVA tenderness, left CVA tenderness, guarding or rebound.  Genitourinary:    Exam position: Lithotomy position.     Labia:        Right: No rash, tenderness, lesion or injury.        Left: No rash, tenderness, lesion or injury.      Vagina: Normal.     Cervix: Normal.     Comments: No discharge noted Chaperone: Rhonda Gordon, NT No odor noted Musculoskeletal:        General: Normal range of motion.     Cervical back: Normal range of motion.     Comments: 5 out of 5 bilateral grip/leg extension strength  Skin:    General: Skin is warm and dry.  Capillary Refill: Capillary refill takes less than 2 seconds.  Neurological:     General: No focal deficit present.     Mental Status: She is alert and oriented to person, place, and time.     Comments: Sensation intact in all 4 limbs  Psychiatric:        Mood and Affect: Mood normal.     ED Results / Procedures / Treatments   Labs (all labs ordered are listed, but only abnormal results are displayed) Labs Reviewed  WET PREP, GENITAL - Abnormal; Notable for the following components:      Result Value   Clue Cells Wet Prep HPF POC PRESENT (*)    All other components within normal limits  URINALYSIS, ROUTINE W REFLEX MICROSCOPIC - Abnormal; Notable for the following components:   APPearance HAZY (*)    Protein, ur  30 (*)    Nitrite POSITIVE (*)    Leukocytes,Ua MODERATE (*)    Bacteria, UA MANY (*)    All other components within normal limits  BASIC METABOLIC PANEL  CBC WITH DIFFERENTIAL/PLATELET  PREGNANCY, URINE  HIV ANTIBODY (ROUTINE TESTING W REFLEX)  RPR  GC/CHLAMYDIA PROBE AMP ([place]) NOT AT [facility]    EKG None  Radiology No results found.  Procedures Procedures    Medications Ordered in ED Medications - No data to display  ED Course/ Medical Decision Making/ A&P                             Medical Decision Making Amount and/or Complexity of Data Reviewed Labs: ordered.   Rhonda Gordon 32 y.o. presented today for suprapubic tenderness. Working DDx that I considered at this time includes, but not limited to, UTI, STI, peritonitis, pyelonephritis, nephrolithiasis.  R/o DDx: Peritonitis, STI, pyelonephritis, nephrolithiasis: These are considered less likely due to history of present illness and physical exam findings  Review of prior external notes: 01/16/2022 ED  Unique Tests and My Interpretation:  CBC with differential: Unremarkable BMP: Unremarkable UA: Negative Urine pregnancy: Nitrate positive with moderate leukocytes and many bacteria Wet mount: Clue cells present Gonorrhea chlamydia: Pending HIV antibody: Pending RPR: Pending  Discussion with Independent Historian: None  Discussion of Management of Tests: None  Risk:    Medium:  - prescription drug management  Risk Stratification Score: None  Plan: Patient presented for suprapubic tenderness. On exam patient was in no acute distress and had stable vitals.  Patient was tender in the suprapubic region however no peritoneal signs were noted.  Given patient's history and physical exam I highly suspect this is a UTI and so labs along with a urine will be ordered.  Patient stable at this time.  Patient's labs came back positive for UTI.  Patient was placed on Keflex with primary care  follow-up.  When I went to go discharge the patient for her UTI patient asked why she was not being tested for STDs.  I spoke with the patient about how she denied vaginal discharge with me earlier and that is why.  Patient will be tested for gonorrhea/chlamydia, syphilis, HIV, BV/trichomonas as she verbalized she want to be tested for all of these.  Wet mount came back positive for clue cells.  Patient will be treated with Flagyl for BV along with the Keflex for her UTI.  Patient was given return precautions. Patient stable for discharge at this time.  Patient verbalized understanding of plan.  Final Clinical Impression(s) / ED Diagnoses Final diagnoses:  Acute cystitis without hematuria  BV (bacterial vaginosis)    Rx / DC Orders ED Discharge Orders          Ordered    cephALEXin (KEFLEX) 500 MG capsule  4 times daily        10/06/22 2126    metroNIDAZOLE (FLAGYL) 500 MG tablet  2 times daily        10/06/22 2203              Elvina Sidle 10/06/22 2205    Teressa Lower, MD 10/07/22 1131

## 2022-10-06 NOTE — ED Provider Triage Note (Signed)
Emergency Medicine Provider Triage Evaluation Note  Rhonda Gordon , a 32 y.o. female  was evaluated in triage.  Pt complains of suprapubic abdominal pain for 1 week.  Patient states she recently had new sex partner and the day after started having dysuria and had water and cranberry juice as this usually helps her UTI-like symptoms however she is still experiencing symptoms.  Denies chest pain, shortness of breath, nausea/vomiting, fever/chills, flank pain, headaches, vision changes  Review of Systems  Positive: HPI Negative: See HPI  Physical Exam  There were no vitals taken for this visit. Gen:   Awake, no distress   Resp:  Normal effort  MSK:   Moves extremities without difficulty  Other:  Suprapubic tenderness, no peritoneal signs noted  Medical Decision Making  Medically screening exam initiated at 8:19 PM.  Appropriate orders placed.  Rhonda Gordon was informed that the remainder of the evaluation will be completed by another provider, this initial triage assessment does not replace that evaluation, and the importance of remaining in the ED until their evaluation is complete.  Workup initiated, patient stable at this time   Rhonda Gordon Rhonda Gordon Rhonda Gordon

## 2022-10-06 NOTE — Discharge Instructions (Addendum)
Please pick up the antibiotic I have prescribed for you and take as prescribed.  Please follow-up with a primary care provider in the next few days to be reevaluated as things may change.  Please continue to follow-up with your MyChart app regarding your syphilis, HIV, gonorrhea/chlamydia results.  Please do not drink alcohol while you are on the Flagyl as this might result in upset stomach and nausea/vomiting.  Please monitor your symptoms and if they worsen please return to the ER.

## 2022-10-07 LAB — GC/CHLAMYDIA PROBE AMP (~~LOC~~) NOT AT ARMC
Chlamydia: NEGATIVE
Comment: NEGATIVE
Comment: NORMAL
Neisseria Gonorrhea: NEGATIVE

## 2022-10-07 LAB — RPR: RPR Ser Ql: NONREACTIVE
